# Patient Record
Sex: Male | Born: 1975 | Race: Black or African American | Hispanic: No | Marital: Married | Smoking: Former smoker
Health system: Southern US, Community
[De-identification: ages and names within clinical notes are randomized; demographics above are authoritative.]

## PROBLEM LIST (undated history)

## (undated) DIAGNOSIS — K219 Gastro-esophageal reflux disease without esophagitis: Secondary | ICD-10-CM

## (undated) DIAGNOSIS — J45909 Unspecified asthma, uncomplicated: Secondary | ICD-10-CM

## (undated) DIAGNOSIS — N289 Disorder of kidney and ureter, unspecified: Secondary | ICD-10-CM

## (undated) HISTORY — PX: EXPLORATORY LAPAROTOMY: SUR591

---

## 2014-08-16 ENCOUNTER — Encounter (HOSPITAL_COMMUNITY): Payer: Self-pay | Admitting: Emergency Medicine

## 2014-08-16 ENCOUNTER — Emergency Department (HOSPITAL_COMMUNITY)
Admission: EM | Admit: 2014-08-16 | Discharge: 2014-08-16 | Disposition: A | Discharge status: Home / Self Care | Payer: No Typology Code available for payment source | Attending: Emergency Medicine | Admitting: Emergency Medicine

## 2014-08-16 DIAGNOSIS — Y92410 Unspecified street and highway as the place of occurrence of the external cause: Secondary | ICD-10-CM | POA: Diagnosis not present

## 2014-08-16 DIAGNOSIS — Z8619 Personal history of other infectious and parasitic diseases: Secondary | ICD-10-CM | POA: Diagnosis not present

## 2014-08-16 DIAGNOSIS — J45909 Unspecified asthma, uncomplicated: Secondary | ICD-10-CM | POA: Insufficient documentation

## 2014-08-16 DIAGNOSIS — Z720 Tobacco use: Secondary | ICD-10-CM | POA: Insufficient documentation

## 2014-08-16 DIAGNOSIS — M79651 Pain in right thigh: Secondary | ICD-10-CM

## 2014-08-16 DIAGNOSIS — S79921A Unspecified injury of right thigh, initial encounter: Secondary | ICD-10-CM | POA: Insufficient documentation

## 2014-08-16 DIAGNOSIS — Y9389 Activity, other specified: Secondary | ICD-10-CM | POA: Insufficient documentation

## 2014-08-16 DIAGNOSIS — Y998 Other external cause status: Secondary | ICD-10-CM | POA: Insufficient documentation

## 2014-08-16 HISTORY — DX: Unspecified asthma, uncomplicated: J45.909

## 2014-08-16 HISTORY — DX: Gastro-esophageal reflux disease without esophagitis: K21.9

## 2014-08-16 MED ORDER — IBUPROFEN 800 MG PO TABS: 800.0000 mg | ORAL_TABLET | Freq: Once | ORAL | Status: DC
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
## 2015-05-14 ENCOUNTER — Emergency Department
Admission: EM | Admit: 2015-05-14 | Discharge: 2015-05-14 | Disposition: A | Discharge status: Home / Self Care | Payer: Self-pay | Attending: Emergency Medicine | Admitting: Emergency Medicine

## 2015-05-14 ENCOUNTER — Encounter: Payer: Self-pay | Admitting: Emergency Medicine

## 2015-05-14 ENCOUNTER — Emergency Department: Payer: Self-pay

## 2015-05-14 DIAGNOSIS — R3129 Other microscopic hematuria: Secondary | ICD-10-CM | POA: Insufficient documentation

## 2015-05-14 DIAGNOSIS — F17200 Nicotine dependence, unspecified, uncomplicated: Secondary | ICD-10-CM | POA: Insufficient documentation

## 2015-05-14 DIAGNOSIS — N23 Unspecified renal colic: Secondary | ICD-10-CM | POA: Insufficient documentation

## 2015-05-14 LAB — LIPASE, BLOOD: Lipase: 17 U/L (ref 11–51)

## 2015-05-14 LAB — COMPREHENSIVE METABOLIC PANEL
ALK PHOS: 28 U/L — AB (ref 38–126)
ALT: 15 U/L — AB (ref 17–63)
AST: 23 U/L (ref 15–41)
Albumin: 4.7 g/dL (ref 3.5–5.0)
Anion gap: 6 (ref 5–15)
BUN: 18 mg/dL (ref 6–20)
CALCIUM: 9.6 mg/dL (ref 8.9–10.3)
CO2: 27 mmol/L (ref 22–32)
CREATININE: 1.33 mg/dL — AB (ref 0.61–1.24)
Chloride: 107 mmol/L (ref 101–111)
Glucose, Bld: 94 mg/dL (ref 65–99)
Potassium: 4 mmol/L (ref 3.5–5.1)
Sodium: 140 mmol/L (ref 135–145)
Total Bilirubin: 0.7 mg/dL (ref 0.3–1.2)
Total Protein: 7 g/dL (ref 6.5–8.1)

## 2015-05-14 LAB — CBC
HCT: 42.5 % (ref 40.0–52.0)
Hemoglobin: 14 g/dL (ref 13.0–18.0)
MCH: 29.8 pg (ref 26.0–34.0)
MCHC: 33 g/dL (ref 32.0–36.0)
MCV: 90.4 fL (ref 80.0–100.0)
PLATELETS: 184 10*3/uL (ref 150–440)
RBC: 4.7 MIL/uL (ref 4.40–5.90)
RDW: 13.7 % (ref 11.5–14.5)
WBC: 5.4 10*3/uL (ref 3.8–10.6)

## 2015-05-14 LAB — URINALYSIS COMPLETE WITH MICROSCOPIC (ARMC ONLY)
BILIRUBIN URINE: NEGATIVE
Bacteria, UA: NONE SEEN
Glucose, UA: NEGATIVE mg/dL
KETONES UR: NEGATIVE mg/dL
Leukocytes, UA: NEGATIVE
Nitrite: NEGATIVE
PH: 5 (ref 5.0–8.0)
Protein, ur: NEGATIVE mg/dL
Specific Gravity, Urine: 1.02 (ref 1.005–1.030)
Squamous Epithelial / LPF: NONE SEEN

## 2015-05-14 MED ORDER — NAPROXEN 500 MG PO TABS: 500.0000 mg | ORAL_TABLET | Freq: Two times a day (BID) | ORAL | Status: DC

## 2015-05-14 MED ORDER — NAPROXEN 500 MG PO TABS
ORAL_TABLET | ORAL | Status: AC
  Filled 2015-05-14: qty 1

## 2015-05-14 MED ORDER — TAMSULOSIN HCL 0.4 MG PO CAPS
0.4000 mg | ORAL_CAPSULE | ORAL | Status: AC
  Administered 2015-05-14: 0.4 mg via ORAL

## 2015-05-14 MED ORDER — ONDANSETRON 4 MG PO TBDP
ORAL_TABLET | ORAL | Status: AC
  Filled 2015-05-14: qty 1

## 2015-05-14 MED ORDER — TAMSULOSIN HCL 0.4 MG PO CAPS
ORAL_CAPSULE | ORAL | Status: AC
  Filled 2015-05-14: qty 1

## 2015-05-14 MED ORDER — ONDANSETRON 4 MG PO TBDP: 4.0000 mg | ORAL_TABLET | Freq: Once | ORAL | Status: DC | PRN

## 2015-05-14 MED ORDER — NAPROXEN 500 MG PO TABS
500.0000 mg | ORAL_TABLET | Freq: Once | ORAL | Status: AC
  Administered 2015-05-14: 500 mg via ORAL

## 2015-05-14 NOTE — ED Notes (Signed)
Pt reports left side abd pain radiating into left back area.  Nausea earlier today.  No v/d.  States pain improved now but was worse while at work [employer].  Pt alert.

## 2015-05-14 NOTE — ED Provider Notes (Signed)
[facility] Emergency Department Provider Note  ____________________________________________  Time seen: 9:30 PM  I have reviewed the triage vital signs and the nursing notes.   HISTORY  Chief Complaint Abdominal Pain    HPI Marc Booth is a 40 y.o. male who complains of left flank pain radiating down into the left groin area that started earlier today. He's had multiple episodes that are severe, colicky, lasting 30-60 minutes at a time. No aggravating or alleviating factors. No vomiting or diarrhea but is associated with nausea. No chest pain shortness of breath fevers or chills. He has some dysuria but no urgency or hesitancy. No hematuria. He has had a kidney stone once before.  Most recent pain episode was at 5:00 PM tonight. He's been pain-free since 5:30 PM.   Past Medical History  Diagnosis Date  . Acid reflux   . Asthma    kidney stone   There are no active problems to display for this patient.    Past Surgical History  Procedure Laterality Date  . Exploratory laparotomy       No current outpatient prescriptions on file. None  Allergies Onion and Protonix   No family history on file.  Social History Social History  Substance Use Topics  . Smoking status: Current Every Day Smoker -- 0.25 packs/day for 4 years  . Smokeless tobacco: Never Used  . Alcohol Use: No    Review of Systems  Constitutional:   No fever or chills. No weight changes Eyes:   No blurry vision or double vision.  ENT:   No sore throat.  Cardiovascular:   No chest pain. Respiratory:   No dyspnea or cough. Gastrointestinal:   Negative for abdominal pain, vomiting and diarrhea.  No BRBPR or melena. Genitourinary:   Positive dysuria.. Musculoskeletal:   Left flank pain as above.. Skin:   Negative for rash. Neurological:   Negative for headaches, focal weakness or numbness. Psychiatric:  No anxiety or depression.   Endocrine:  No changes in energy or  sleep difficulty.  10-point ROS otherwise negative.  ____________________________________________   PHYSICAL EXAM:  VITAL SIGNS: ED Triage Vitals  Enc Vitals Group     BP 05/14/15 1849 110/78 mmHg     Pulse Rate 05/14/15 1849 72     Resp 05/14/15 1849 18     Temp 05/14/15 1849 97.6 F (36.4 C)     Temp Source 05/14/15 1849 Oral     SpO2 05/14/15 1849 98 %     Weight 05/14/15 1849 156 lb (70.761 kg)     Height 05/14/15 1849  (1.676 m)     Head Cir --      Peak Flow --      Pain Score 05/14/15 1849 10     Pain Loc --      Pain Edu? --      Excl. in GC? --     Vital signs reviewed, nursing assessments reviewed.   Constitutional:   Alert and oriented. Well appearing and in no distress. Eyes:   No scleral icterus. No conjunctival pallor. PERRL. EOMI ENT   Head:   Normocephalic and atraumatic.   Nose:   No congestion/rhinnorhea. No septal hematoma   Mouth/Throat:   MMM, no pharyngeal erythema. No peritonsillar mass.    Neck:   No stridor. No SubQ emphysema. No meningismus. Hematological/Lymphatic/Immunilogical:   No cervical lymphadenopathy. Cardiovascular:   RRR. Symmetric bilateral radial and DP pulses.  No murmurs.  Respiratory:   Normal  respiratory effort without tachypnea nor retractions. Breath sounds are clear and equal bilaterally. No wheezes/rales/rhonchi. Gastrointestinal:   Soft with left mid abdominal tenderness.. Non distended. There is left side CVA tenderness.  No rebound, rigidity, or guarding. Genitourinary:   deferred Musculoskeletal:   Nontender with normal range of motion in all extremities. No joint effusions.  No lower extremity tenderness.  No edema. Neurologic:   Normal speech and language.  CN 2-10 normal. Motor grossly intact. No gross focal neurologic deficits are appreciated.  Skin:    Skin is warm, dry and intact. No rash noted.  No petechiae, purpura, or bullae. Psychiatric:   Mood and affect are  normal. ____________________________________________    LABS (pertinent positives/negatives) (all labs ordered are listed, but only abnormal results are displayed) Labs Reviewed  COMPREHENSIVE METABOLIC PANEL - Abnormal; Notable for the following:    Creatinine, Ser 1.33 (*)    ALT 15 (*)    Alkaline Phosphatase 28 (*)    All other components within normal limits  URINALYSIS COMPLETEWITH MICROSCOPIC ([facility] ONLY) - Abnormal; Notable for the following:    Color, Urine YELLOW (*)    APPearance CLEAR (*)    Hgb urine dipstick 2+ (*)    All other components within normal limits  LIPASE, BLOOD  CBC   ____________________________________________   EKG    ____________________________________________    RADIOLOGY  CT abdomen pelvis reveals 2 mm left UVJ stone, no obstruction.  ____________________________________________   PROCEDURES   ____________________________________________   INITIAL IMPRESSION / ASSESSMENT AND PLAN / ED COURSE  Pertinent labs & imaging results that were available during my care of the patient were reviewed by me and considered in my medical decision making (see chart for details).  Patient presents with symptoms consistent with left-sided renal colic. He has some microscopic hematuria on urinalysis consistent with ureterolithiasis. Labs are otherwise unremarkable except for creatinine of 1.3. No evidence of infection whatsoever. Her blood cell count normal. With his tenderness on exam and creatinine we will get a CT scan of the abdomen and pelvis to evaluate for an obstructing stone.   ----------------------------------------- 10:37 PM on 05/14/2015 -----------------------------------------  No further pain. Calm and comfortable. We'll give Flomax and naproxen here, discharge home with naproxen.    ____________________________________________   FINAL CLINICAL IMPRESSION(S) / ED DIAGNOSES  Final diagnoses:  Renal colic on left side   Microscopic hematuria      Sharman CheekPhillip Sharnese Heath, MD 05/14/15 2237

## 2015-05-14 NOTE — ED Notes (Signed)
Patient reports severe LLQ abdominal pain that began at work approx 1 hour ago. States he had similar episode a few days ago. Patient also c/o nausea. Denies diarrhea or fevers.

## 2015-05-14 NOTE — Discharge Instructions (Signed)
Kidney Stones °Kidney stones (urolithiasis) are deposits that form inside your kidneys. The intense pain is caused by the stone moving through the urinary tract. When the stone moves, the ureter goes into spasm around the stone. The stone is usually passed in the urine.  °CAUSES  °· A disorder that makes certain neck glands produce too much parathyroid hormone (primary hyperparathyroidism). °· A buildup of uric acid crystals, similar to gout in your joints. °· Narrowing (stricture) of the ureter. °· A kidney obstruction present at birth (congenital obstruction). °· Previous surgery on the kidney or ureters. °· Numerous kidney infections. °SYMPTOMS  °· Feeling sick to your stomach (nauseous). °· Throwing up (vomiting). °· Blood in the urine (hematuria). °· Pain that usually spreads (radiates) to the groin. °· Frequency or urgency of urination. °DIAGNOSIS  °· Taking a history and physical exam. °· Blood or urine tests. °· CT scan. °· Occasionally, an examination of the inside of the urinary bladder (cystoscopy) is performed. °TREATMENT  °· Observation. °· Increasing your fluid intake. °· Extracorporeal shock wave lithotripsy--This is a noninvasive procedure that uses shock waves to break up kidney stones. °· Surgery may be needed if you have severe pain or persistent obstruction. There are various surgical procedures. Most of the procedures are performed with the use of small instruments. Only small incisions are needed to accommodate these instruments, so recovery time is minimized. °The size, location, and chemical composition are all important variables that will determine the proper choice of action for you. Talk to your health care provider to better understand your situation so that you will minimize the risk of injury to yourself and your kidney.  °HOME CARE INSTRUCTIONS  °· Drink enough water and fluids to keep your urine clear or pale yellow. This will help you to pass the stone or stone fragments. °· Strain  all urine through the provided strainer. Keep all particulate matter and stones for your health care provider to see. The stone causing the pain may be as small as a grain of salt. It is very important to use the strainer each and every time you pass your urine. The collection of your stone will allow your health care provider to analyze it and verify that a stone has actually passed. The stone analysis will often identify what you can do to reduce the incidence of recurrences. °· Only take over-the-counter or prescription medicines for pain, discomfort, or fever as directed by your health care provider. °· Keep all follow-up visits as told by your health care provider. This is important. °· Get follow-up X-rays if required. The absence of pain does not always mean that the stone has passed. It may have only stopped moving. If the urine remains completely obstructed, it can cause loss of kidney function or even complete destruction of the kidney. It is your responsibility to make sure X-rays and follow-ups are completed. Ultrasounds of the kidney can show blockages and the status of the kidney. Ultrasounds are not associated with any radiation and can be performed easily in a matter of minutes. °· Make changes to your daily diet as told by your health care provider. You may be told to: °¨ Limit the amount of salt that you eat. °¨ Eat 5 or more servings of fruits and vegetables each day. °¨ Limit the amount of meat, poultry, fish, and eggs that you eat. °· Collect a 24-hour urine sample as told by your health care provider. You may need to collect another urine sample every 6-12   months. °SEEK MEDICAL CARE IF: °· You experience pain that is progressive and unresponsive to any pain medicine you have been prescribed. °SEEK IMMEDIATE MEDICAL CARE IF:  °· Pain cannot be controlled with the prescribed medicine. °· You have a fever or shaking chills. °· The severity or intensity of pain increases over 18 hours and is not  relieved by pain medicine. °· You develop a new onset of abdominal pain. °· You feel faint or pass out. °· You are unable to urinate. °  °This information is not intended to replace advice given to you by your health care provider. Make sure you discuss any questions you have with your health care provider. °  °Document Released: 02/16/2005 Document Revised: 11/07/2014 Document Reviewed: 07/20/2012 °Elsevier Interactive Patient Education ©2016 [employer] ° °Renal Colic °Renal colic is pain that is caused by passing a kidney stone. The pain can be sharp and severe. It may be felt in the back, abdomen, side (flank), or groin. It can cause nausea. Renal colic can come and go. °HOME CARE INSTRUCTIONS °Watch your condition for any changes. The following actions may help to lessen any discomfort that you are feeling: °· Take medicines only as directed by your health care provider. °· Ask your health care provider if it is okay to take over-the-counter pain medicine. °· Drink enough fluid to keep your urine clear or pale yellow. Drink 6-8 glasses of water each day. °· Limit the amount of salt that you eat to less than 2 grams per day. °· Reduce the amount of protein in your diet. Eat less meat, fish, nuts, and dairy. °· Avoid foods such as spinach, rhubarb, nuts, or bran. These may make kidney stones more likely to form. °SEEK MEDICAL CARE IF: °· You have a fever or chills. °· Your urine smells bad or looks cloudy. °· You have pain or burning when you pass urine. °SEEK IMMEDIATE MEDICAL CARE IF: °· Your flank pain or groin pain suddenly worsens. °· You become confused or disoriented or you lose consciousness. °  °This information is not intended to replace advice given to you by your health care provider. Make sure you discuss any questions you have with your health care provider. °  °Document Released: 11/26/2004 Document Revised: 03/09/2014 Document Reviewed: 12/27/2013 °Elsevier Interactive Patient Education ©2016  [employer] ° °

## 2015-05-14 NOTE — ED Notes (Signed)
Patient transported to CT 

## 2015-05-16 MED ORDER — DEXAMETHASONE SODIUM PHOSPHATE 10 MG/ML IJ SOLN
INTRAMUSCULAR | Status: AC
  Filled 2015-05-16: qty 1

## 2015-05-16 MED ORDER — IPRATROPIUM-ALBUTEROL 0.5-2.5 (3) MG/3ML IN SOLN
RESPIRATORY_TRACT | Status: AC
Start: 2015-05-16 — End: 2015-05-16
  Filled 2015-05-16: qty 3

## 2017-02-12 ENCOUNTER — Other Ambulatory Visit: Payer: Self-pay

## 2017-02-12 ENCOUNTER — Emergency Department (HOSPITAL_COMMUNITY)
Admission: EM | Admit: 2017-02-12 | Discharge: 2017-02-12 | Disposition: A | Discharge status: Home / Self Care | Payer: No Typology Code available for payment source | Attending: Emergency Medicine | Admitting: Emergency Medicine

## 2017-02-12 ENCOUNTER — Encounter (HOSPITAL_COMMUNITY): Payer: Self-pay

## 2017-02-12 DIAGNOSIS — Z87891 Personal history of nicotine dependence: Secondary | ICD-10-CM | POA: Insufficient documentation

## 2017-02-12 DIAGNOSIS — J45909 Unspecified asthma, uncomplicated: Secondary | ICD-10-CM | POA: Insufficient documentation

## 2017-02-12 DIAGNOSIS — R319 Hematuria, unspecified: Secondary | ICD-10-CM | POA: Insufficient documentation

## 2017-02-12 DIAGNOSIS — Z79899 Other long term (current) drug therapy: Secondary | ICD-10-CM | POA: Insufficient documentation

## 2017-02-12 HISTORY — DX: Disorder of kidney and ureter, unspecified: N28.9

## 2017-02-12 LAB — CBC
HCT: 41.7 % (ref 39.0–52.0)
Hemoglobin: 13.8 g/dL (ref 13.0–17.0)
MCH: 29.7 pg (ref 26.0–34.0)
MCHC: 33.1 g/dL (ref 30.0–36.0)
MCV: 89.9 fL (ref 78.0–100.0)
Platelets: 213 10*3/uL (ref 150–400)
RBC: 4.64 MIL/uL (ref 4.22–5.81)
RDW: 14 % (ref 11.5–15.5)
WBC: 5.1 10*3/uL (ref 4.0–10.5)

## 2017-02-12 LAB — URINALYSIS, ROUTINE W REFLEX MICROSCOPIC
BACTERIA UA: NONE SEEN
Bilirubin Urine: NEGATIVE
Glucose, UA: NEGATIVE mg/dL
Ketones, ur: 5 mg/dL — AB
Leukocytes, UA: NEGATIVE
Nitrite: NEGATIVE
PROTEIN: NEGATIVE mg/dL
Specific Gravity, Urine: 1.018 (ref 1.005–1.030)
pH: 5 (ref 5.0–8.0)

## 2017-02-12 LAB — BASIC METABOLIC PANEL
Anion gap: 9 (ref 5–15)
BUN: 10 mg/dL (ref 6–20)
CALCIUM: 9.7 mg/dL (ref 8.9–10.3)
CO2: 25 mmol/L (ref 22–32)
Chloride: 105 mmol/L (ref 101–111)
Creatinine, Ser: 1.09 mg/dL (ref 0.61–1.24)
GFR calc Af Amer: >60 mL/min (ref >60)
GLUCOSE: 87 mg/dL (ref 65–99)
Potassium: 4.1 mmol/L (ref 3.5–5.1)
SODIUM: 139 mmol/L (ref 135–145)

## 2017-02-12 NOTE — ED Provider Notes (Signed)
[facility]ENC[facility] Provider Note   CSN: [id number] Arrival date & time: 02/12/17  1202     History   Chief Complaint Chief Complaint  Patient presents with  . Hematuria    HPI Marc JunkerMichael Booth is a 41 y.o. male.   He presents for evaluation of blood in urine after shoveling rash.  This occurred several days ago and since then he now has clear urine.  He was seen at an urgent care who plans on arranging some follow-up for him.  He has a history of kidney stones but has never seen a urologist.  He denies flank pain, abdominal pain, back pain, dizziness, fever, chills or chest pain.  He has mild pain in his penis, with voiding.  He denies urinary frequency.  No prior similar problems.  His discomfort does not feel like prior problems with kidney stone passage.  There are no other known modifying factors.  HPI  Past Medical History:  Diagnosis Date  . Acid reflux   . Asthma   . Renal disorder    Kidney Stones    There are no active problems to display for this patient.   Past Surgical History:  Procedure Laterality Date  . EXPLORATORY LAPAROTOMY         Home Medications    Prior to Admission medications   Medication Sig Start Date End Date Taking? Authorizing Provider  naproxen (NAPROSYN) 500 MG tablet Take 1 tablet (500 mg total) by mouth 2 (two) times daily with a meal. 05/14/15   Sharman CheekStafford, Phillip, MD    Family History No family history on file.  Social History Social History   Tobacco Use  . Smoking status: Former Smoker    Packs/day: 0.25    Years: 4.00    Pack years: 1.00  . Smokeless tobacco: Never Used  Substance Use Topics  . Alcohol use: No  . Drug use: No     Allergies   Onion and Protonix [pantoprazole sodium]   Review of Systems Review of Systems  All other systems reviewed and are negative.    Physical Exam Updated Vital Signs BP 126/77   Pulse 68   Temp 97.6 F (36.4 C) (Oral)   Resp 16   Ht  5\' 6"  (1.676 m)   Wt 69.9 kg (154 lb)   SpO2 98%   BMI 24.86 kg/m   Physical Exam  Constitutional: He is oriented to person, place, and time. He appears well-developed and well-nourished. No distress.  HENT:  Head: Normocephalic and atraumatic.  Right Ear: External ear normal.  Left Ear: External ear normal.  Eyes: Conjunctivae and EOM are normal. Pupils are equal, round, and reactive to light.  Neck: Normal range of motion and phonation normal. Neck supple.  Cardiovascular: Normal rate.  Pulmonary/Chest: Effort normal. He exhibits no bony tenderness.  Musculoskeletal: Normal range of motion.  Neurological: He is alert and oriented to person, place, and time. No cranial nerve deficit or sensory deficit. He exhibits normal muscle tone. Coordination normal.  Skin: Skin is warm, dry and intact.  Psychiatric: He has a normal mood and affect. His behavior is normal. Judgment and thought content normal.  Nursing note and vitals reviewed.    ED Treatments / Results  Labs (all labs ordered are listed, but only abnormal results are displayed) Labs Reviewed  URINALYSIS, ROUTINE W REFLEX MICROSCOPIC - Abnormal; Notable for the following components:      Result Value   APPearance HAZY (*)  Hgb urine dipstick LARGE (*)    Ketones, ur 5 (*)    Squamous Epithelial / LPF 0-5 (*)    All other components within normal limits  BASIC METABOLIC PANEL  CBC    EKG  EKG Interpretation None       Radiology No results found.  Procedures Procedures (including critical care time)  Medications Ordered in ED Medications - No data to display   Initial Impression / Assessment and Plan / ED Course  I have reviewed the triage vital signs and the nursing notes.  Pertinent labs & imaging results that were available during my care of the patient were reviewed by me and considered in my medical decision making (see chart for details).  Clinical Course as of Feb 13 1839  Fri Feb 12, 2017    [id number] Abnormal Hgb urine dipstick: (!) LARGE [EW]  1839 Abnormal RBC / HPF: TOO NUMEROUS TO COUNT [EW]    Clinical Course User Index [EW] Mancel BaleWentz, Lynasia Meloche, MD     Patient Vitals for the past 24 hrs:  BP Temp Temp src Pulse Resp SpO2 Height Weight  02/12/17 1645 119/85 - - 72 - 100 % - -  02/12/17 1630 (!) 124/92 - - 70 - 99 % - -  02/12/17 1526 126/77 - - 68 16 98 % - -  02/12/17 1207 131/89 97.6 F (36.4 C) Oral 83 16 98 % 5\' 6"  (1.676 m) 69.9 kg (154 lb)    5:27 PM Reevaluation with update and discussion. After initial assessment and treatment, an updated evaluation reveals no change in clinical status.  Findings discussed with the patient and all questions were answered. Mancel BaleElliott Sourish Allender   CK ordered not returned at time of discharge  Final Clinical Impressions(s) / ED Diagnoses   Final diagnoses:  Hematuria, unspecified type    Hematuria, initially gross now improving.  Patient with history of urolithiasis, but does not have flank pain at this time.  Doubt ureteral obstruction, serious bacterial infection or metabolic instability.  Nursing Notes Reviewed/ Care Coordinated Applicable Imaging Reviewed Interpretation of Laboratory Data incorporated into ED treatment  The patient appears reasonably screened and/or stabilized for discharge and I doubt any other medical condition or other [facility] requiring further screening, evaluation, or treatment in the ED at this time prior to discharge.  Plan: Home Medications-APAP for pain as needed; Home Treatments-drink plenty of fluids; return here if the recommended treatment, does not improve the symptoms; Recommended follow up-follow-up with urology regarding hematuria, 1 or 2 weeks.    ED Discharge Orders    None       Mancel BaleWentz, Laurieanne Galloway, MD 02/12/17 1840

## 2017-02-12 NOTE — Discharge Instructions (Signed)
There were no serious findings today associated with the small amount of blood (microscopic), seen on the testing today.  For pain you can take Tylenol, 650 mg, every 4 hours, as needed.  Call the urologist for a follow-up appointment.

## 2017-02-12 NOTE — ED Triage Notes (Signed)
Per Pt, Pt is coming from home with complaints of hematuria that started after he shoveled snow a couple days ago. [facility] protein [facility]. Pt was told to see Urologist. He was unable to get in contact, and now he is starting to have pain.

## 2017-02-15 ENCOUNTER — Emergency Department: Payer: Self-pay

## 2017-02-15 ENCOUNTER — Other Ambulatory Visit: Payer: Self-pay

## 2017-02-15 ENCOUNTER — Encounter: Payer: Self-pay | Admitting: Emergency Medicine

## 2017-02-15 ENCOUNTER — Emergency Department
Admission: EM | Admit: 2017-02-15 | Discharge: 2017-02-15 | Disposition: A | Discharge status: Home / Self Care | Payer: Self-pay | Attending: Emergency Medicine | Admitting: Emergency Medicine

## 2017-02-15 DIAGNOSIS — Z87891 Personal history of nicotine dependence: Secondary | ICD-10-CM | POA: Insufficient documentation

## 2017-02-15 DIAGNOSIS — J45909 Unspecified asthma, uncomplicated: Secondary | ICD-10-CM | POA: Insufficient documentation

## 2017-02-15 DIAGNOSIS — N200 Calculus of kidney: Secondary | ICD-10-CM | POA: Insufficient documentation

## 2017-02-15 LAB — CBC WITH DIFFERENTIAL/PLATELET
BASOS ABS: 0 10*3/uL (ref 0–0.1)
Basophils Relative: 1 %
EOS PCT: 3 %
Eosinophils Absolute: 0.1 10*3/uL (ref 0–0.7)
HCT: 40.6 % (ref 40.0–52.0)
Hemoglobin: 13.2 g/dL (ref 13.0–18.0)
LYMPHS PCT: 38 %
Lymphs Abs: 1.6 10*3/uL (ref 1.0–3.6)
MCH: 29.3 pg (ref 26.0–34.0)
MCHC: 32.6 g/dL (ref 32.0–36.0)
MCV: 89.8 fL (ref 80.0–100.0)
Monocytes Absolute: 0.3 10*3/uL (ref 0.2–1.0)
Monocytes Relative: 7 %
NEUTROS ABS: 2.1 10*3/uL (ref 1.4–6.5)
Neutrophils Relative %: 51 %
Platelets: 210 10*3/uL (ref 150–440)
RBC: 4.52 MIL/uL (ref 4.40–5.90)
RDW: 13.9 % (ref 11.5–14.5)
WBC: 4.2 10*3/uL (ref 3.8–10.6)

## 2017-02-15 LAB — COMPREHENSIVE METABOLIC PANEL
ALT: 14 U/L — ABNORMAL LOW (ref 17–63)
ANION GAP: 8 (ref 5–15)
AST: 21 U/L (ref 15–41)
Albumin: 4.3 g/dL (ref 3.5–5.0)
Alkaline Phosphatase: 30 U/L — ABNORMAL LOW (ref 38–126)
BILIRUBIN TOTAL: 0.5 mg/dL (ref 0.3–1.2)
BUN: 13 mg/dL (ref 6–20)
CALCIUM: 9.5 mg/dL (ref 8.9–10.3)
CO2: 28 mmol/L (ref 22–32)
Chloride: 105 mmol/L (ref 101–111)
Creatinine, Ser: 1.04 mg/dL (ref 0.61–1.24)
GFR calc Af Amer: >60 mL/min (ref >60)
GFR calc non Af Amer: >60 mL/min (ref >60)
GLUCOSE: 86 mg/dL (ref 65–99)
Potassium: 4.1 mmol/L (ref 3.5–5.1)
Sodium: 141 mmol/L (ref 135–145)
Total Protein: 6.8 g/dL (ref 6.5–8.1)

## 2017-02-15 LAB — URINALYSIS, COMPLETE (UACMP) WITH MICROSCOPIC
BACTERIA UA: NONE SEEN
BILIRUBIN URINE: NEGATIVE
Glucose, UA: NEGATIVE mg/dL
Hgb urine dipstick: NEGATIVE
Ketones, ur: NEGATIVE mg/dL
LEUKOCYTES UA: NEGATIVE
NITRITE: NEGATIVE
Protein, ur: NEGATIVE mg/dL
Specific Gravity, Urine: 1.02 (ref 1.005–1.030)
pH: 6 (ref 5.0–8.0)

## 2017-02-15 LAB — CHLAMYDIA/NGC RT PCR (ARMC ONLY)
Chlamydia Tr: NOT DETECTED
N gonorrhoeae: NOT DETECTED

## 2017-02-15 MED ORDER — TAMSULOSIN HCL 0.4 MG PO CAPS: 0.4000 mg | ORAL_CAPSULE | Freq: Every day | ORAL | 0 refills | Status: DC

## 2017-02-15 MED ORDER — SODIUM CHLORIDE 0.9 % IV BOLUS (SEPSIS)
1000.0000 mL | Freq: Once | INTRAVENOUS | Status: AC
  Administered 2017-02-15: 1000 mL via INTRAVENOUS

## 2017-02-15 MED ORDER — TRAMADOL HCL 50 MG PO TABS: 50.0000 mg | ORAL_TABLET | Freq: Four times a day (QID) | ORAL | 0 refills | Status: DC | PRN

## 2017-02-15 NOTE — ED Notes (Signed)
Pt reports pain in left groin area and penis for several days.  Pt states no penile discharge   Hx kidney stones.  No n/v/d.  No dysuria. Pt denies back pain.  Pt alert.

## 2017-02-15 NOTE — ED Provider Notes (Signed)
[facility] Emergency Department Provider Note  ____________________________________________  Time seen: Approximately 5:15 PM  I have reviewed the triage vital signs and the nursing notes.   HISTORY  Chief Complaint Dysuria    HPI Marc Booth is a 41 y.o. male resents the emergency department complaining of dysuria.  Patient was evaluated at [facility] 3 days prior for complaints of hematuria.  Patient reports that symptoms began several days ago.  Initially he had frank blood in his urine and was evaluated at urgent care.  Patient reports that he was then referred to the emergency department for possible kidney stones.  At that time, patient did not have frank hematuria but did have microscopic hematuria on urinalysis.  Initially, patient had some back pain which she attributed to shoveling snow.  Back pain had fully resolved when he was seen at [facility].  Today, patient denies any back pain, flank pain, abdominal pain, nausea vomiting, fever or chills.  Patient is endorsing continued sharp dysuria.  He denies any penile discharge.  He states that he has had only one sexual partner, his wife and there is no concern for STD.  Patient denies any frank hematuria at this time.  Patient is concerned as he has ongoing symptoms of dysuria.  Patient does have a history of kidney stones but states that nobody has done any imaging to evaluate whether he has a stone or not.  Past Medical History:  Diagnosis Date  . Acid reflux   . Asthma   . Renal disorder    Kidney Stones    There are no active problems to display for this patient.   Past Surgical History:  Procedure Laterality Date  . EXPLORATORY LAPAROTOMY      Prior to Admission medications   Medication Sig Start Date End Date Taking? Authorizing Provider  naproxen (NAPROSYN) 500 MG tablet Take 1 tablet (500 mg total) by mouth 2 (two) times daily with a meal. Patient not taking: Reported on 02/12/2017  05/14/15   Sharman CheekStafford, Phillip, MD  tamsulosin (FLOMAX) 0.4 MG CAPS capsule Take 1 capsule (0.4 mg total) by mouth daily. 02/15/17   Cuthriell, Delorise RoyalsJonathan D, PA-C  traMADol (ULTRAM) 50 MG tablet Take 1 tablet (50 mg total) by mouth every 6 (six) hours as needed. 02/15/17   Cuthriell, Delorise RoyalsJonathan D, PA-C    Allergies Onion and Protonix [pantoprazole sodium]  No family history on file.  Social History Social History   Tobacco Use  . Smoking status: Former Smoker    Packs/day: 0.25    Years: 4.00    Pack years: 1.00  . Smokeless tobacco: Never Used  Substance Use Topics  . Alcohol use: No  . Drug use: No     Review of Systems  Constitutional: No fever/chills Eyes: No visual changes. No discharge ENT: No upper respiratory complaints. Cardiovascular: no chest pain. Respiratory: no cough. No SOB. Gastrointestinal: No abdominal pain.  No nausea, no vomiting.  No diarrhea.  No constipation. Genitourinary: Patient initially had frank hematuria, no visible hematuria at this time.  Patient continues to have dysuria.  No penile discharge. Musculoskeletal: Negative for musculoskeletal pain. Skin: Negative for rash, abrasions, lacerations, ecchymosis. Neurological: Negative for headaches, focal weakness or numbness. 10-point ROS otherwise negative.  ____________________________________________   PHYSICAL EXAM:  VITAL SIGNS: ED Triage Vitals  Enc Vitals Group     BP 02/15/17 1708 127/84     Pulse Rate 02/15/17 1708 72     Resp 02/15/17 1708 18  Temp 02/15/17 1708 98 F (36.7 C)     Temp Source 02/15/17 1708 Oral     SpO2 02/15/17 1708 97 %     Weight 02/15/17 1708 154 lb (69.9 kg)     Height 02/15/17 1708 5\' 6"  (1.676 m)     Head Circumference --      Peak Flow --      Pain Score 02/15/17 1707 7     Pain Loc --      Pain Edu? --      Excl. in GC? --      Constitutional: Alert and oriented. Well appearing and in no acute distress. Eyes: Conjunctivae are normal. PERRL.  EOMI. Head: Atraumatic. ENT:      Ears:       Nose: No congestion/rhinnorhea.      Mouth/Throat: Mucous membranes are moist.  Neck: No stridor.    Cardiovascular: Normal rate, regular rhythm. Normal S1 and S2.  Good peripheral circulation. Respiratory: Normal respiratory effort without tachypnea or retractions. Lungs CTAB. Good air entry to the bases with no decreased or absent breath sounds. Gastrointestinal: Bowel sounds 4 quadrants. Soft and nontender to palpation. No guarding or rigidity. No palpable masses. No distention.  Patient did have mild left-sided CVA tenderness on exam. Musculoskeletal: Full range of motion to all extremities. No gross deformities appreciated. Neurologic:  Normal speech and language. No gross focal neurologic deficits are appreciated.  Skin:  Skin is warm, dry and intact. No rash noted. Psychiatric: Mood and affect are normal. Speech and behavior are normal. Patient exhibits appropriate insight and judgement.   ____________________________________________   LABS (all labs ordered are listed, but only abnormal results are displayed)  Labs Reviewed  URINALYSIS, COMPLETE (UACMP) WITH MICROSCOPIC - Abnormal; Notable for the following components:      Result Value   Color, Urine YELLOW (*)    APPearance CLEAR (*)    Squamous Epithelial / LPF 0-5 (*)    All other components within normal limits  COMPREHENSIVE METABOLIC PANEL - Abnormal; Notable for the following components:   ALT 14 (*)    Alkaline Phosphatase 30 (*)    All other components within normal limits  CHLAMYDIA/NGC RT PCR ([facility] ONLY)  CBC WITH DIFFERENTIAL/PLATELET   ____________________________________________  EKG   ____________________________________________  RADIOLOGY Festus BarrenI, Jonathan D Cuthriell, personally viewed and evaluated these images as part of my medical decision making, as well as reviewing the written report by the radiologist.  Ct Renal Stone Study  Result Date:  02/15/2017 CLINICAL DATA:  Hematuria with left groin pain EXAM: CT ABDOMEN AND PELVIS WITHOUT CONTRAST TECHNIQUE: Multidetector CT imaging of the abdomen and pelvis was performed following the standard protocol without IV contrast. COMPARISON:  CT abdomen pelvis 05/14/2015 FINDINGS: Lower chest: Negative Hepatobiliary: Negative Pancreas: Negative Spleen: Negative Adrenals/Urinary Tract: Previously noted left midpole calculus no longer present. Left ureter nondilated however there are 2 5mm adjacent stones in the distal left ureter similar in size to the midpole stone seen on the prior study. No right renal calculi or obstruction. 12 mm hypodense lesion left midpole cortex unchanged, likely a cyst. Stomach/Bowel: Negative for bowel obstruction. No bowel mass or edema. Normal appendix Vascular/Lymphatic: Negative Reproductive: Negative Other: No free fluid. Musculoskeletal: Negative IMPRESSION: Two 5 mm calculi in the distal left ureter without hydronephrosis. No other renal calculi. Stable left renal hypodense lesion likely a 12 mm cyst. Electronically Signed   By: Marlan Palauharles  Clark M.D.   On: 02/15/2017 18:35  ____________________________________________    PROCEDURES  Procedure(s) performed:    Procedures    Medications  sodium chloride 0.9 % bolus 1,000 mL (1,000 mLs Intravenous New Bag/Given 02/15/17 1747)     ____________________________________________   INITIAL IMPRESSION / ASSESSMENT AND PLAN / ED COURSE  Pertinent labs & imaging results that were available during my care of the patient were reviewed by me and considered in my medical decision making (see chart for details).  Review of the [place] CSRS was performed in accordance of the [facility] prior to dispensing any controlled drugs.     Patient's diagnosis is consistent with nephrolithiasis.  Patient has 2 5 mm stones in the distal left ureter.  No visual obstruction on CT.  Differential included kidney stone versus UTI versus  gonorrhea and chlamydia versus cystitis.  Labs returned with reassuring results.  No indication for further workup.. Patient will be discharged home with prescriptions for Flomax and tramadol. Patient is to follow up with primary care as needed or otherwise directed. Patient is given ED precautions to return to the ED for any worsening or new symptoms.     ____________________________________________  FINAL CLINICAL IMPRESSION(S) / ED DIAGNOSES  Final diagnoses:  Kidney stones      NEW MEDICATIONS STARTED DURING THIS VISIT:  ED Discharge Orders        Ordered    traMADol (ULTRAM) 50 MG tablet  Every 6 hours PRN     02/15/17 1908    tamsulosin (FLOMAX) 0.4 MG CAPS capsule  Daily     02/15/17 1908          This chart was dictated using voice recognition software/Dragon. Despite best efforts to proofread, errors can occur which can change the meaning. Any change was purely unintentional.    Racheal Patches, PA-C 02/15/17 1910    Sharyn Creamer, MD 02/16/17 [phone]

## 2017-02-15 NOTE — ED Triage Notes (Signed)
Pain with urination x 3 days

## 2017-11-03 ENCOUNTER — Emergency Department (HOSPITAL_COMMUNITY)
Admission: EM | Admit: 2017-11-03 | Discharge: 2017-11-03 | Disposition: A | Discharge status: Home / Self Care | Payer: 59 | Attending: Emergency Medicine | Admitting: Emergency Medicine

## 2017-11-03 ENCOUNTER — Encounter (HOSPITAL_COMMUNITY): Payer: Self-pay

## 2017-11-03 ENCOUNTER — Other Ambulatory Visit: Payer: Self-pay

## 2017-11-03 DIAGNOSIS — R200 Anesthesia of skin: Secondary | ICD-10-CM | POA: Insufficient documentation

## 2017-11-03 DIAGNOSIS — Z87891 Personal history of nicotine dependence: Secondary | ICD-10-CM | POA: Insufficient documentation

## 2017-11-03 DIAGNOSIS — J45909 Unspecified asthma, uncomplicated: Secondary | ICD-10-CM | POA: Insufficient documentation

## 2017-11-03 DIAGNOSIS — R202 Paresthesia of skin: Secondary | ICD-10-CM

## 2017-11-03 MED ORDER — PREDNISONE 20 MG PO TABS
60.0000 mg | ORAL_TABLET | Freq: Once | ORAL | Status: AC
  Administered 2017-11-03: 60 mg via ORAL
  Filled 2017-11-03: qty 3

## 2017-11-03 MED ORDER — PREDNISONE 20 MG PO TABS: ORAL_TABLET | ORAL | 0 refills | Status: DC

## 2017-11-03 MED ORDER — KETOROLAC TROMETHAMINE 60 MG/2ML IM SOLN
60.0000 mg | Freq: Once | INTRAMUSCULAR | Status: AC
  Administered 2017-11-03: 60 mg via INTRAMUSCULAR
  Filled 2017-11-03: qty 2
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
## 2017-11-03 NOTE — ED Triage Notes (Signed)
Pt states he first noticed numbness and tingling in his left arm yesterday, but it got worse today. Pt has FROM in arm, but says when he moves it "it just feels different" .  No complaints in legs.

## 2018-05-14 ENCOUNTER — Emergency Department
Admission: EM | Admit: 2018-05-14 | Discharge: 2018-05-14 | Disposition: A | Discharge status: Home / Self Care | Payer: 59 | Attending: Emergency Medicine | Admitting: Emergency Medicine

## 2018-05-14 ENCOUNTER — Encounter: Payer: Self-pay | Admitting: Emergency Medicine

## 2018-05-14 ENCOUNTER — Other Ambulatory Visit: Payer: Self-pay

## 2018-05-14 DIAGNOSIS — T39314A Poisoning by propionic acid derivatives, undetermined, initial encounter: Secondary | ICD-10-CM

## 2018-05-14 DIAGNOSIS — Y92009 Unspecified place in unspecified non-institutional (private) residence as the place of occurrence of the external cause: Secondary | ICD-10-CM | POA: Insufficient documentation

## 2018-05-14 DIAGNOSIS — F4325 Adjustment disorder with mixed disturbance of emotions and conduct: Secondary | ICD-10-CM | POA: Diagnosis present

## 2018-05-14 DIAGNOSIS — R45851 Suicidal ideations: Secondary | ICD-10-CM | POA: Insufficient documentation

## 2018-05-14 DIAGNOSIS — F410 Panic disorder [episodic paroxysmal anxiety] without agoraphobia: Secondary | ICD-10-CM | POA: Diagnosis present

## 2018-05-14 DIAGNOSIS — F319 Bipolar disorder, unspecified: Secondary | ICD-10-CM | POA: Insufficient documentation

## 2018-05-14 DIAGNOSIS — J45909 Unspecified asthma, uncomplicated: Secondary | ICD-10-CM | POA: Insufficient documentation

## 2018-05-14 DIAGNOSIS — T39392A Poisoning by other nonsteroidal anti-inflammatory drugs [NSAID], intentional self-harm, initial encounter: Secondary | ICD-10-CM | POA: Insufficient documentation

## 2018-05-14 DIAGNOSIS — F17200 Nicotine dependence, unspecified, uncomplicated: Secondary | ICD-10-CM | POA: Insufficient documentation

## 2018-05-14 DIAGNOSIS — Y999 Unspecified external cause status: Secondary | ICD-10-CM | POA: Insufficient documentation

## 2018-05-14 DIAGNOSIS — T39311A Poisoning by propionic acid derivatives, accidental (unintentional), initial encounter: Secondary | ICD-10-CM

## 2018-05-14 DIAGNOSIS — Y9389 Activity, other specified: Secondary | ICD-10-CM | POA: Insufficient documentation

## 2018-05-14 DIAGNOSIS — X838XXA Intentional self-harm by other specified means, initial encounter: Secondary | ICD-10-CM | POA: Insufficient documentation

## 2018-05-14 LAB — CBC
HEMATOCRIT: 46.1 % (ref 39.0–52.0)
HEMOGLOBIN: 15.3 g/dL (ref 13.0–17.0)
MCH: 29.8 pg (ref 26.0–34.0)
MCHC: 33.2 g/dL (ref 30.0–36.0)
MCV: 89.7 fL (ref 80.0–100.0)
NRBC: 0 % (ref 0.0–0.2)
Platelets: 233 10*3/uL (ref 150–400)
RBC: 5.14 MIL/uL (ref 4.22–5.81)
RDW: 13.4 % (ref 11.5–15.5)
WBC: 7.1 10*3/uL (ref 4.0–10.5)

## 2018-05-14 LAB — COMPREHENSIVE METABOLIC PANEL
ALBUMIN: 4.7 g/dL (ref 3.5–5.0)
ALT: 15 U/L (ref 0–44)
AST: 24 U/L (ref 15–41)
Alkaline Phosphatase: 32 U/L — ABNORMAL LOW (ref 38–126)
Anion gap: 9 (ref 5–15)
BUN: 11 mg/dL (ref 6–20)
CO2: 27 mmol/L (ref 22–32)
CREATININE: 1.11 mg/dL (ref 0.61–1.24)
Calcium: 9.5 mg/dL (ref 8.9–10.3)
Chloride: 102 mmol/L (ref 98–111)
GFR calc Af Amer: >60 mL/min (ref >60)
GLUCOSE: 141 mg/dL — AB (ref 70–99)
Potassium: 3.6 mmol/L (ref 3.5–5.1)
Sodium: 138 mmol/L (ref 135–145)
Total Bilirubin: 0.8 mg/dL (ref 0.3–1.2)
Total Protein: 7.4 g/dL (ref 6.5–8.1)

## 2018-05-14 LAB — ACETAMINOPHEN LEVEL
Acetaminophen (Tylenol), Serum: <10 ug/mL — ABNORMAL LOW (ref 10–30)
Acetaminophen (Tylenol), Serum: <10 ug/mL — ABNORMAL LOW (ref 10–30)

## 2018-05-14 LAB — ETHANOL

## 2018-05-14 LAB — SALICYLATE LEVEL: Salicylate Lvl: <7 mg/dL (ref 2.8–30.0)

## 2018-05-14 MED ORDER — ESCITALOPRAM OXALATE 10 MG PO TABS: 10.0000 mg | ORAL_TABLET | Freq: Every day | ORAL | 2 refills | Status: DC

## 2018-05-14 NOTE — ED Provider Notes (Signed)
[facility] Emergency Department Provider Note  Time seen: 1:46 PM  I have reviewed the triage vital signs and the nursing notes.   HISTORY  Chief Complaint IVC    HPI Marc Booth is a 43 y.o. male with a past medical history of gastric reflux, asthma, kidney stones, presents to the emergency department after intentional overdose.  According to the patient he got into an argument this morning with his wife over "a misunderstanding."  Patient states he took around 5 to 7 tablets of naproxen in an attempt to hurt himself around 11 AM this morning but then immediately regretted the decision.  Patient denies any alcohol or drug use.  Patient denies any wish to hurt himself or anyone else at this time but does admit taking the pills earlier in frustration.  Denies any medical complaints today.  Negative review of systems.   Past Medical History:  Diagnosis Date  . Acid reflux   . Asthma   . Renal disorder    Kidney Stones    There are no active problems to display for this patient.   Past Surgical History:  Procedure Laterality Date  . EXPLORATORY LAPAROTOMY      Prior to Admission medications   Medication Sig Start Date End Date Taking? Authorizing Provider  naproxen (NAPROSYN) 500 MG tablet Take 1 tablet (500 mg total) by mouth 2 (two) times daily with a meal. Patient not taking: Reported on 02/12/2017 05/14/15   Sharman Cheek, MD  predniSONE (DELTASONE) 20 MG tablet 3 tabs po daily x 3 days, then 2 tabs x 3 days, then 1.5 tabs x 3 days, then 1 tab x 3 days, then 0.5 tabs x 3 days 11/03/17   Mesner, Barbara Cower, MD  tamsulosin (FLOMAX) 0.4 MG CAPS capsule Take 1 capsule (0.4 mg total) by mouth daily. 02/15/17   Cuthriell, Delorise Royals, PA-C  traMADol (ULTRAM) 50 MG tablet Take 1 tablet (50 mg total) by mouth every 6 (six) hours as needed. 02/15/17   Cuthriell, Delorise Royals, PA-C    Allergies  Allergen Reactions  . Onion Nausea And Vomiting  . Protonix  [Pantoprazole Sodium] Itching    No family history on file.  Social History Social History   Tobacco Use  . Smoking status: Current Every Day Smoker    Packs/day: 0.25    Years: 4.00    Pack years: 1.00  . Smokeless tobacco: Never Used  Substance Use Topics  . Alcohol use: Yes    Comment: social   . Drug use: No    Review of Systems Constitutional: Negative for fever. Cardiovascular: Negative for chest pain. Respiratory: Negative for shortness of breath. Gastrointestinal: Negative for abdominal pain Musculoskeletal: Negative for musculoskeletal complaints Skin: Negative for skin complaints  Neurological: Negative for headache All other ROS negative  ____________________________________________   PHYSICAL EXAM:  VITAL SIGNS: ED Triage Vitals  Enc Vitals Group     BP 05/14/18 1156 (!) 157/91     Pulse Rate 05/14/18 1156 (!) 102     Resp 05/14/18 1156 16     Temp 05/14/18 1156 98.2 F (36.8 C)     Temp Source 05/14/18 1156 Oral     SpO2 05/14/18 1156 99 %     Weight 05/14/18 1200 154 lb (69.9 kg)     Height 05/14/18 1200 5\' 7"  (1.702 m)     Head Circumference --      Peak Flow --      Pain Score 05/14/18 1200 0  Pain Loc --      Pain Edu? --      Excl. in GC? --    Constitutional: Alert and oriented. Well appearing and in no distress. Eyes: Normal exam ENT   Mouth/Throat: Mucous membranes are moist. Cardiovascular: Normal rate, regular rhythm. No murmur Respiratory: Normal respiratory effort without tachypnea nor retractions. Breath sounds are clear  Gastrointestinal: Soft and nontender. No distention.   Musculoskeletal: Nontender with normal range of motion in all extremities Neurologic:  Normal speech and language. No gross focal neurologic deficits Skin:  Skin is warm, dry and intact.  Psychiatric: Mood and affect are normal.   ____________________________________________   INITIAL IMPRESSION / ASSESSMENT AND PLAN / ED COURSE  Pertinent  labs & imaging results that were available during my care of the patient were reviewed by me and considered in my medical decision making (see chart for details).  Patient presents emergency department after an intentional overdose of naproxen at 11 AM after getting into an argument with his wife.  Patient does regret taking the medication.  We will check labs including a 4-hour repeat acetaminophen level just as a precaution which will be at 3 PM.  Currently the patient appears well he has no symptoms and has a normal physical examination.  Largely normal vitals besides slight hypertension/tachycardia.  Patient's lab work is largely within normal limits, urine drug screen pending.  We will maintain the IVC into the patient is seen by psychiatry.  Psychiatric evaluation and disposition pending.  ____________________________________________   FINAL CLINICAL IMPRESSION(S) / ED DIAGNOSES  Intentional overdose   Minna Antis, MD 05/14/18 1349
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
## 2018-05-14 NOTE — ED Notes (Signed)
Psychiatrist at bedside. Maintained on 15 minute checks and observation by security camera for safety. 

## 2018-05-14 NOTE — ED Notes (Signed)
EDP at bedside. Maintained on 15 minute checks and observation by security camera for safety.

## 2018-05-14 NOTE — Discharge Instructions (Addendum)
If you have any thoughts of hurting yourself or anyone else return to the emergency room.

## 2018-05-14 NOTE — ED Provider Notes (Signed)
-----------------------------------------   7:38 PM on 05/14/2018 -----------------------------------------  Medically cleared, Tylenol is negative, per signout if that is negative psychiatric disposition will be in affect.  Psychiatrist has seen him, they feel he is safe for discharge.  Patient has no SI or HI we will discharge.  Return precautions follow-up given and understood.   Jeanmarie Plant, MD 05/14/18 [phone]

## 2018-05-14 NOTE — ED Notes (Signed)
Pt dressed out. Pt belongings bag:  1 white t shirt 1 black t shirt 1 pair of black shoes 1 pair grey socks 1 brown pants 1 blue/white underwear 1 brown belt 1 grey hat 1 pink cable 1 black charging brick 1 silver colored watch 1 set of keys 5$ cash 1 black wallet 1 red cell phone 1 mouth spray  Pt allowed to keep a pair of glasses.

## 2018-05-14 NOTE — Consult Note (Signed)
[facility] Face-to-Face Psychiatry Consult   Reason for Consult: Ingestion of 5-7 Naprosyn tablets Referring Physician:  ED Patient Identification: Marc Booth MRN:  [id number] Principal Diagnosis: Naproxen overdose Diagnosis:  Principal Problem:   Naproxen overdose Active Problems:   Adjustment disorder with mixed disturbance of emotions and conduct   Panic disorder   Total Time spent with patient: 45 minutes  Subjective/HPI:   Marc Booth is a 43 y.o. male post history of GERD asthma kidney stones tobacco use disorder who presented after overdosing on about 5 to 7 tablets of naproxen about 11:00 on the morning of 05/14/2018. He states she said something he didn't like and instead of going out to walk or smoke he took the pills, not to hurt himself, states he just did it, should not have, has never done anything like that before.   He immediately regretted the ingestion of the medication and states he has no intention of hurting himself or anyone else.  After taking the pills he states he drank some vegetable oil and water and made himself vomit and he called his father who brought him to the emergency department.  He reports a history of anxiety attacks but no history of depression.  CMP CBC unremarkable acetaminophen salicylate alcohol undetectable.  He denies illicit drug use, except for trying a little marijuana a month ago when he was on vacation no drug screen obtained.  Psychiatric intake assessment therapist spoke with his wife who agrees that this was an attention seeking measure and that he is okay to come home.  Patient reports that his father is told him that he recommends that they get counseling as a couple.  The patient would also like to start treatment for anxiety episodes he states he has panic attacks off and on and has been thinking of trying some medication for those, he denies actual depression.    Past Psychiatric History: He reports anxiety attacks  Risk to Self: Suicidal  Ideation: No Suicidal Intent: No Is patient at risk for suicide?: No Suicidal Plan?: No Access to Means: No What has been your use of drugs/alcohol within the last 12 months?: Cannabis How many times?: 0 Other Self Harm Risks: Reports of none Triggers for Past Attempts: None known Intentional Self Injurious Behavior: None Risk to Others: Homicidal Ideation: No Thoughts of Harm to Others: No Current Homicidal Intent: No Current Homicidal Plan: No Access to Homicidal Means: No Identified Victim: Reports of none History of harm to others?: No Assessment of Violence: None Noted Violent Behavior Description: Reports of none Does patient have access to weapons?: No Criminal Charges Pending?: No Does patient have a court date: No Prior Inpatient Therapy: Prior Inpatient Therapy: No Prior Outpatient Therapy: Prior Outpatient Therapy: No Does patient have an ACCT team?: No Does patient have Intensive In-House Services?  : No Does patient have [facility] services? : No Does patient have P4CC services?: No  Past Medical History:  Past Medical History:  Diagnosis Date  . Acid reflux   . Asthma   . Renal disorder    Kidney Stones    Past Surgical History:  Procedure Laterality Date  . EXPLORATORY LAPAROTOMY     Family History: No family history on file.  Social History:  Social History   Substance and Sexual Activity  Alcohol Use Yes   Comment: social      Social History   Substance and Sexual Activity  Drug Use No    Social History   Socioeconomic History  .  Marital status: Single    Spouse name: Not on file  . Number of children: Not on file  . Years of education: Not on file  . Highest education level: Not on file  Occupational History  . Not on file  Social Needs  . Financial resource strain: Not on file  . Food insecurity:    Worry: Not on file    Inability: Not on file  . Transportation needs:    Medical: Not on file    Non-medical: Not on file  Tobacco  Use  . Smoking status: Current Every Day Smoker    Packs/day: 0.25    Years: 4.00    Pack years: 1.00  . Smokeless tobacco: Never Used  Substance and Sexual Activity  . Alcohol use: Yes    Comment: social   . Drug use: No  . Sexual activity: Yes  Lifestyle  . Physical activity:    Days per week: Not on file    Minutes per session: Not on file  . Stress: Not on file  Relationships  . Social connections:    Talks on phone: Not on file    Gets together: Not on file    Attends religious service: Not on file    Active member of club or organization: Not on file    Attends meetings of clubs or organizations: Not on file    Relationship status: Not on file  Other Topics Concern  . Not on file  Social History Narrative  . Not on file   Additional Social History:    Allergies:   Allergies  Allergen Reactions  . Onion Nausea And Vomiting  . Protonix [Pantoprazole Sodium] Itching    Labs:  Results for orders placed or performed during the hospital encounter of 05/14/18 (from the past 48 hour(s))  Comprehensive metabolic panel     Status: Abnormal   Collection Time: 05/14/18 12:04 PM  Result Value Ref Range   Sodium 138 135 - 145 mmol/L   Potassium 3.6 3.5 - 5.1 mmol/L   Chloride 102 98 - 111 mmol/L   CO2 27 22 - 32 mmol/L   Glucose, Bld 141 (H) 70 - 99 mg/dL   BUN 11 6 - 20 mg/dL   Creatinine, Ser 9.21 0.61 - 1.24 mg/dL   Calcium 9.5 8.9 - 19.4 mg/dL   Total Protein 7.4 6.5 - 8.1 g/dL   Albumin 4.7 3.5 - 5.0 g/dL   AST 24 15 - 41 U/L   ALT 15 0 - 44 U/L   Alkaline Phosphatase 32 (L) 38 - 126 U/L   Total Bilirubin 0.8 0.3 - 1.2 mg/dL   GFR calc non Af Amer >60 >60 mL/min   GFR calc Af Amer >60 >60 mL/min   Anion gap 9 5 - 15    Comment: Performed at [facility], [address] [place], [place] [address]  Ethanol     Status: None   Collection Time: 05/14/18 12:04 PM  Result Value Ref Range   Alcohol, Ethyl (B) <10 <10 mg/dL    Comment: (NOTE) Lowest  detectable limit for serum alcohol is 10 mg/dL. For medical purposes only. Performed at [facility], [address] [place], [place] [address]   Salicylate level     Status: None   Collection Time: 05/14/18 12:04 PM  Result Value Ref Range   Salicylate Lvl <7.0 2.8 - 30.0 mg/dL    Comment: Performed at [facility], [address] [place], [place]  [id number]  Acetaminophen level     Status: Abnormal   Collection Time: 05/14/18 12:04 PM  Result Value Ref Range   Acetaminophen (Tylenol), Serum <10 (L) 10 - 30 ug/mL    Comment: (NOTE) Therapeutic concentrations vary significantly. A range of 10-30 ug/mL  may be an effective concentration for many patients. However, some  are best treated at concentrations outside of this range. Acetaminophen concentrations >150 ug/mL at 4 hours after ingestion  and >50 ug/mL at 12 hours after ingestion are often associated with  toxic reactions. Performed at [facility], [address] [place], [place] [address]   cbc     Status: None   Collection Time: 05/14/18 12:04 PM  Result Value Ref Range   WBC 7.1 4.0 - 10.5 K/uL   RBC 5.14 4.22 - 5.81 MIL/uL   Hemoglobin 15.3 13.0 - 17.0 g/dL   HCT 09.846.1 11.939.0 - 14.752.0 %   MCV 89.7 80.0 - 100.0 fL   MCH 29.8 26.0 - 34.0 pg   MCHC 33.2 30.0 - 36.0 g/dL   RDW 82.913.4 56.211.5 - 13.015.5 %   Platelets 233 150 - 400 K/uL   nRBC 0.0 0.0 - 0.2 %    Comment: Performed at [facility], [address] [place], [place] [address]  Acetaminophen level     Status: Abnormal   Collection Time: 05/14/18  4:57 PM  Result Value Ref Range   Acetaminophen (Tylenol), Serum <10 (L) 10 - 30 ug/mL    Comment: (NOTE) Therapeutic concentrations vary significantly. A range of 10-30 ug/mL  may be an effective concentration for many patients. However, some  are best treated at concentrations outside of this range. Acetaminophen concentrations >150 ug/mL at 4 hours after ingestion  and >50 ug/mL at  12 hours after ingestion are often associated with  toxic reactions. Performed at [facility], [address] [place], [place] [address]     No current facility-administered medications for this encounter.    Current Outpatient Medications  Medication Sig Dispense Refill  . naproxen (NAPROSYN) 500 MG tablet Take 1 tablet (500 mg total) by mouth 2 (two) times daily with a meal. (Patient not taking: Reported on 02/12/2017) 20 tablet 0  . predniSONE (DELTASONE) 20 MG tablet 3 tabs po daily x 3 days, then 2 tabs x 3 days, then 1.5 tabs x 3 days, then 1 tab x 3 days, then 0.5 tabs x 3 days 27 tablet 0  . tamsulosin (FLOMAX) 0.4 MG CAPS capsule Take 1 capsule (0.4 mg total) by mouth daily. 30 capsule 0  . traMADol (ULTRAM) 50 MG tablet Take 1 tablet (50 mg total) by mouth every 6 (six) hours as needed. 15 tablet 0    Musculoskeletal: Strength & Muscle Tone: within normal limits Gait & Station: normal Patient leans: N/A  Psychiatric Specialty Exam: Physical Exam  ROS  Blood pressure 124/82, pulse 73, temperature 98.2 F (36.8 C), temperature source Oral, resp. rate 16, height 5\' 7"  (1.702 m), weight 69.9 kg, SpO2 98 %.Body mass index is 24.12 kg/m.  General Appearance: Well Groomed  Eye Contact:  Good  Speech:  Clear and Coherent  Volume:  Normal  Mood:  Anxious  Affect:  Congruent  Thought Process:  Coherent  Orientation:  Full (Time, Place, and Person)  Thought Content:  Logical  Suicidal Thoughts:  No  Homicidal Thoughts:  No  Memory:  Immediate;   Good Recent;   Good Remote;   Good  Judgement:  Good  Insight:  Good  Psychomotor Activity:  Normal  Concentration:  Concentration: Good and Attention Span: Good  Recall:  Good  Fund of Knowledge:  Good  Language:  Good  Akathisia:  No  Handed:  Right  AIMS (if indicated):     Assets:  Communication Skills Desire for Improvement Social Support  ADL's:  Intact  Cognition:  WNL  Sleep:        Treatment  Plan Summary: Plan Lexapro 10 mg tablet 1/2 tablet daily for 4 days then 1 tablet daily for panic disorder, discharged to family, follow-up with [facility] for outpatient therapy and medication management  Disposition: No evidence of imminent risk to self or others at present.   Patient does not meet criteria for psychiatric inpatient admission. Discussed crisis plan, support from social network, calling 911, coming to the Emergency Department, and calling Suicide Hotline. Follow-up at [facility], MD 05/14/2018 6:29 PM

## 2018-05-14 NOTE — ED Notes (Signed)
TTS at bedside. Maintained on 15 minute checks and observation by security camera for safety. 

## 2018-05-14 NOTE — ED Triage Notes (Signed)
Pt to ED via [employer]. Pt states that he and his wife got into an argument. Pt took 5-6 naproxen pills. Pt states that he drink some vegetable oil and water and made himself vomit after taking the medication. Pt states that he was upset and that is why he took the medication. Pt states that he was not attempting to harm himself, he was just upset over what his wife said. Pt states that he does not have a hx/o depression, has had anxiety attacks in the past but they do not require treatment. Pt is calm and cooperative in triage at this time. Pt is in NAD.   Pt denies thoughts of wanting to harm himself or anyone else.

## 2018-05-14 NOTE — ED Notes (Signed)
Pt given a food tray and drink. Pt given hand sanitizer.

## 2018-05-14 NOTE — ED Notes (Signed)
Pt. Going home with friend 

## 2019-01-05 ENCOUNTER — Emergency Department
Admission: EM | Admit: 2019-01-05 | Discharge: 2019-01-05 | Disposition: A | Discharge status: Home / Self Care | Payer: Self-pay | Attending: Emergency Medicine | Admitting: Emergency Medicine

## 2019-01-05 ENCOUNTER — Other Ambulatory Visit: Payer: Self-pay

## 2019-01-05 ENCOUNTER — Encounter: Payer: Self-pay | Admitting: Emergency Medicine

## 2019-01-05 DIAGNOSIS — L03116 Cellulitis of left lower limb: Secondary | ICD-10-CM | POA: Insufficient documentation

## 2019-01-05 DIAGNOSIS — Z79899 Other long term (current) drug therapy: Secondary | ICD-10-CM | POA: Insufficient documentation

## 2019-01-05 DIAGNOSIS — J45909 Unspecified asthma, uncomplicated: Secondary | ICD-10-CM | POA: Insufficient documentation

## 2019-01-05 DIAGNOSIS — F17210 Nicotine dependence, cigarettes, uncomplicated: Secondary | ICD-10-CM | POA: Insufficient documentation

## 2019-01-05 MED ORDER — SULFAMETHOXAZOLE-TRIMETHOPRIM 800-160 MG PO TABS: 1.0000 | ORAL_TABLET | Freq: Two times a day (BID) | ORAL | 0 refills | Status: AC

## 2019-01-05 MED ORDER — CEPHALEXIN 500 MG PO CAPS: 500.0000 mg | ORAL_CAPSULE | Freq: Three times a day (TID) | ORAL | 0 refills | Status: AC

## 2019-01-05 NOTE — Discharge Instructions (Signed)
Take Bactrim twice daily for the next 7 days. Take Keflex 3 times daily for the next 7 days. Apply warm compresses to affected area and gently exfoliate scab in affected area. Return to the emergency department with worsening redness of the left thigh.

## 2019-01-05 NOTE — ED Triage Notes (Signed)
Pt reports has a bump on his thigh that has been there for 3 days and it is painful. Pt reports area is red.

## 2019-01-05 NOTE — ED Notes (Signed)
Pt with red raised area on left lower thigh. Area is tender to tough and hard. Pt states pain is worse when he moves it. Started on Monday.

## 2019-01-05 NOTE — ED Provider Notes (Signed)
[facility] Emergency Department Provider Note  ____________________________________________  Time seen: Approximately 3:40 PM  I have reviewed the triage vital signs and the nursing notes.   HISTORY  Chief Complaint Abscess and Leg Pain    HPI Marc Booth is a 43 y.o. male presents to the emergency department with concern for left upper thigh redness and pain that has been present for the past 4 to 5 days.  Patient reports that affected area originally looked like an insect bite and erythema has since spread.  He denies fever and chills.  Denies any history of cutaneous skin infections in the past. No other alleviating measures have been attempted.         Past Medical History:  Diagnosis Date  . Acid reflux   . Asthma   . Renal disorder    Kidney Stones    Patient Active Problem List   Diagnosis Date Noted  . 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 Naproxen overdose 05/14/2018  . Panic disorder 05/14/2018    Past Surgical History:  Procedure Laterality Date  . EXPLORATORY LAPAROTOMY      Prior to Admission medications   Medication Sig Start Date End Date Taking? Authorizing Provider  cephALEXin (KEFLEX) 500 MG capsule Take 1 capsule (500 mg total) by mouth 3 (three) times daily for 7 days. 01/05/19 01/12/19  Lannie Fields, PA-C  escitalopram (LEXAPRO) 10 MG tablet Take 1 tablet (10 mg total) by mouth daily. Start with 1/2 tablet daily for 4 days then 1 tablet daily 05/14/18 05/14/19  Patience Musca, MD  sulfamethoxazole-trimethoprim (BACTRIM DS) 800-160 MG tablet Take 1 tablet by mouth 2 (two) times daily for 7 days. 01/05/19 01/12/19  Lannie Fields, PA-C  tamsulosin (FLOMAX) 0.4 MG CAPS capsule Take 1 capsule (0.4 mg total) by mouth daily. 02/15/17   Cuthriell, Charline Bills, PA-C    Allergies Onion and Protonix [pantoprazole sodium]  No family history on file.  Social History Social  History   Tobacco Use  . Smoking status: Current Every Day Smoker    Packs/day: 0.25    Years: 4.00    Pack years: 1.00  . Smokeless tobacco: Never Used  Substance Use Topics  . Alcohol use: Yes    Comment: social   . Drug use: No     Review of Systems  Constitutional: No fever/chills Eyes: No visual changes. No discharge ENT: No upper respiratory complaints. Cardiovascular: no chest pain. Respiratory: no cough. No SOB. Gastrointestinal: No abdominal pain.  No nausea, no vomiting.  No diarrhea.  No constipation. Musculoskeletal: Negative for musculoskeletal pain. Skin: Patient has erythema and induration of left thigh. Neurological: Negative for headaches, focal weakness or numbness.   ____________________________________________   PHYSICAL EXAM:  VITAL SIGNS: ED Triage Vitals  Enc Vitals Group     BP 01/05/19 1458 107/70     Pulse Rate 01/05/19 1458 75     Resp 01/05/19 1458 16     Temp 01/05/19 1458 98.2 F (36.8 C)     Temp Source 01/05/19 1458 Oral     SpO2 01/05/19 1458 96 %     Weight 01/05/19 1455 156 lb (70.8 kg)     Height 01/05/19 1455 5\' 6"  (1.676 m)     Head Circumference --      Peak Flow --      Pain Score 01/05/19 1455 5     Pain Loc --      Pain  Edu? --      Excl. in GC? --      Constitutional: Alert and oriented. Well appearing and in no acute distress. Eyes: Conjunctivae are normal. PERRL. EOMI. Head: Atraumatic. ENT: Cardiovascular: Normal rate, regular rhythm. Normal S1 and S2.  Good peripheral circulation. Respiratory: Normal respiratory effort without tachypnea or retractions. Lungs CTAB. Good air entry to the bases with no decreased or absent breath sounds. Gastrointestinal: Bowel sounds 4 quadrants. Soft and nontender to palpation. No guarding or rigidity. No palpable masses. No distention. No CVA tenderness.* Musculoskeletal: Full range of motion to all extremities. No gross deformities appreciated. Neurologic:  Normal speech and  language. No gross focal neurologic deficits are appreciated.  Skin: Patient has a 4 cm x 4 cm circumferential region of erythema along the left upper thigh with associated induration but no palpable fluctuance.  No streaking. Psychiatric: Mood and affect are normal. Speech and behavior are normal. Patient exhibits appropriate insight and judgement.   ____________________________________________   LABS (all labs ordered are listed, but only abnormal results are displayed)  Labs Reviewed - No data to display ____________________________________________  EKG   ____________________________________________  RADIOLOGY   No results found.  ____________________________________________    PROCEDURES  Procedure(s) performed:    Procedures    Medications - No data to display   ____________________________________________   INITIAL IMPRESSION / ASSESSMENT AND PLAN / ED COURSE  Pertinent labs & imaging results that were available during my care of the patient were reviewed by me and considered in my medical decision making (see chart for details).  Review of the [place] CSRS was performed in accordance of the [facility] prior to dispensing any controlled drugs.           Assessment and plan Cellulitis 43 year old male presents to the emergency department with a 4 cm x 4 cm region of left upper thigh cellulitis.  Vital signs were reassuring at triage.  There was no appreciable fluctuance to palpation that would suggest abscess.  Patient was started empirically on both Bactrim and Keflex and was advised to apply warm compresses to the affected area.  Return precautions were given to return if symptoms do not improve or worsen.  Patient has easy access to the emergency department should symptoms worsen.  All patient questions were answered.   ____________________________________________  FINAL CLINICAL IMPRESSION(S) / ED DIAGNOSES  Final diagnoses:  Cellulitis of left lower  extremity      NEW MEDICATIONS STARTED DURING THIS VISIT:  ED Discharge Orders         Ordered    sulfamethoxazole-trimethoprim (BACTRIM DS) 800-160 MG tablet  2 times daily     01/05/19 1535    cephALEXin (KEFLEX) 500 MG capsule  3 times daily     01/05/19 1535              This chart was dictated using voice recognition software/Dragon. Despite best efforts to proofread, errors can occur which can change the meaning. Any change was purely unintentional.    Orvil Feil, PA-C 01/05/19 1556    Shaune Pollack, MD 01/06/19 [phone]

## 2019-02-03 ENCOUNTER — Emergency Department: Payer: Self-pay

## 2019-02-03 ENCOUNTER — Emergency Department
Admission: EM | Admit: 2019-02-03 | Discharge: 2019-02-03 | Disposition: A | Discharge status: Home / Self Care | Payer: Self-pay | Attending: Emergency Medicine | Admitting: Emergency Medicine

## 2019-02-03 ENCOUNTER — Other Ambulatory Visit: Payer: Self-pay

## 2019-02-03 DIAGNOSIS — S9782XA Crushing injury of left foot, initial encounter: Secondary | ICD-10-CM | POA: Insufficient documentation

## 2019-02-03 DIAGNOSIS — Y92014 Private driveway to single-family (private) house as the place of occurrence of the external cause: Secondary | ICD-10-CM | POA: Insufficient documentation

## 2019-02-03 DIAGNOSIS — F17210 Nicotine dependence, cigarettes, uncomplicated: Secondary | ICD-10-CM | POA: Insufficient documentation

## 2019-02-03 DIAGNOSIS — W230XXA Caught, crushed, jammed, or pinched between moving objects, initial encounter: Secondary | ICD-10-CM | POA: Insufficient documentation

## 2019-02-03 DIAGNOSIS — Y998 Other external cause status: Secondary | ICD-10-CM | POA: Insufficient documentation

## 2019-02-03 DIAGNOSIS — Y9389 Activity, other specified: Secondary | ICD-10-CM | POA: Insufficient documentation

## 2019-02-03 DIAGNOSIS — Z79899 Other long term (current) drug therapy: Secondary | ICD-10-CM | POA: Insufficient documentation

## 2019-02-03 DIAGNOSIS — J45909 Unspecified asthma, uncomplicated: Secondary | ICD-10-CM | POA: Insufficient documentation

## 2019-02-03 MED ORDER — TRAMADOL HCL 50 MG PO TABS
50.0000 mg | ORAL_TABLET | Freq: Once | ORAL | Status: AC
  Administered 2019-02-03: 50 mg via ORAL
  Filled 2019-02-03: qty 1

## 2019-02-03 MED ORDER — TRAMADOL HCL 50 MG PO TABS: 50.0000 mg | ORAL_TABLET | Freq: Four times a day (QID) | ORAL | 0 refills | Status: DC | PRN
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
## 2019-02-03 NOTE — ED Notes (Signed)
Ace wrap to right foot

## 2019-02-03 NOTE — Discharge Instructions (Signed)
Rest, ice, and elevate your right foot often over the next several days.  Follow-up with the podiatrist if not improving over the week.  Return to the emergency department for symptoms of change or worsen if you are unable to schedule appointment.

## 2019-09-11 ENCOUNTER — Emergency Department
Admission: EM | Admit: 2019-09-11 | Discharge: 2019-09-11 | Disposition: A | Discharge status: Home / Self Care | Payer: BC Managed Care – PPO | Attending: Emergency Medicine | Admitting: Emergency Medicine

## 2019-09-11 ENCOUNTER — Encounter: Payer: Self-pay | Admitting: Emergency Medicine

## 2019-09-11 ENCOUNTER — Other Ambulatory Visit: Payer: Self-pay

## 2019-09-11 DIAGNOSIS — J45909 Unspecified asthma, uncomplicated: Secondary | ICD-10-CM | POA: Insufficient documentation

## 2019-09-11 DIAGNOSIS — F17200 Nicotine dependence, unspecified, uncomplicated: Secondary | ICD-10-CM | POA: Insufficient documentation

## 2019-09-11 DIAGNOSIS — K0889 Other specified disorders of teeth and supporting structures: Secondary | ICD-10-CM | POA: Diagnosis not present

## 2019-09-11 MED ORDER — AMOXICILLIN 500 MG PO CAPS: 500.0000 mg | ORAL_CAPSULE | Freq: Three times a day (TID) | ORAL | 0 refills | Status: DC

## 2019-09-11 MED ORDER — NAPROXEN 500 MG PO TABS: 500.0000 mg | ORAL_TABLET | Freq: Two times a day (BID) | ORAL | 2 refills | Status: AC

## 2019-09-11 MED ORDER — TRAMADOL HCL 50 MG PO TABS: 50.0000 mg | ORAL_TABLET | Freq: Four times a day (QID) | ORAL | 0 refills | Status: DC | PRN

## 2019-09-11 NOTE — Discharge Instructions (Addendum)
OPTIONS FOR DENTAL FOLLOW UP CARE ° °[place] Department of Health and Human Services - Local Safety Net Dental Clinics °http://www.ncdhhs.gov/dph/oralhealth/services/safetynetclinics.htm °  °[facility] (336-562-3123) ° °[facility] [place] ([phone]) ° °[facility] ([phone] ext [phone]) ° °[facility] ([phone]) ° °[facility] ([phone]) °This clinic caters to the indigent population and is on a lottery system. °Location: °[facility], [address], [place] °Clinic Hours: °Wednesdays from 6pm - 9pm, patients seen by a lottery system. °For dates, call or go to www.med.[facility].edu/[facility]/patients/Dental-[facility] °Services: °Cleanings, fillings and simple extractions. °Payment Options: °DENTAL WORK IS FREE OF CHARGE. Bring proof of income or support. °Best way to get seen: °Arrive at 5:15 pm - this is a lottery, NOT first come/first serve, so arriving earlier will not increase your chances of being seen. °  °  °[facility] °[phone] °Select option 1 for emergencies °  °Location: °[facility], [address], [place] °Clinic Hours: °No walk-ins accepted - call the day before to schedule an appointment. °Check in times are 9:30 am and 1:30 pm. °Services: °Simple extractions, temporary fillings, pulpectomy/pulp debridement, uncomplicated abscess drainage. °Payment Options: °PAYMENT IS DUE AT THE TIME OF SERVICE.  Fee is usually $100-200, additional surgical procedures (e.g. abscess drainage) may be extra. °Cash, checks, Visa/MasterCard accepted.  Can file Medicaid if patient is covered for dental - patient should call case worker to check. °No discount for [facility] patients. °Best way to get seen: °MUST call the day before and get onto the schedule. Can usually be seen the next 1-2 days. No walk-ins accepted. °  °  °[facility] °[phone] °   °Location: °[facility], [address], [place] °Clinic Hours: °M, W, Th, F 8am or 1:30pm, Tues 9a or 1:30 - first come/first served. °Services: °Simple extractions, temporary fillings, uncomplicated abscess drainage.  You do not need to be an [place] resident. °Payment Options: °PAYMENT IS DUE AT THE TIME OF SERVICE. °Dental insurance, otherwise sliding scale - bring proof of income or support. °Depending on income and treatment needed, cost is usually $50-200. °Best way to get seen: °Arrive early as it is first come/first served. °  °  °[facility] °[phone] °  °Location: °[address] °Clinic Hours: °Mon-Thu 8a-5p °Services: °Most basic dental services including extractions and fillings. °Payment Options: °PAYMENT IS DUE AT THE TIME OF SERVICE. °Sliding scale, up to 50% off - bring proof if income or support. °Medicaid with dental option accepted. °Best way to get seen: °Call to schedule an appointment, can usually be seen within 2 weeks OR they will try to see walk-ins - show up at 8a or 2p (you may have to wait). °  °  °[facility] °919-245-2435 °[place] RESIDENTS ONLY °  °Location: °[facility], [address], [place], [place] [address] °Clinic Hours: By appointment only. °Monday - Thursday 8am-5pm, Friday 8am-12pm °Services: Cleanings, fillings, extractions. °Payment Options: °PAYMENT IS DUE AT THE TIME OF SERVICE. °Cash, Visa or MasterCard. Sliding scale - $30 minimum per service. °Best way to get seen: °Come in to office, complete packet and make an appointment - need proof of income °or support monies for each household member and proof of [place] residence. °Usually takes about a month to get in. °  °  °[facility] °[phone] °  °Location: °[address]   [place] °Clinic Hours: Walk-in Urgent Care Dental Services are offered Monday-Friday  mornings only. °The numbers of emergencies accepted daily is limited to the number of °providers available. °Maximum 15 - Mondays, Wednesdays & Thursdays °Maximum 10 - Tuesdays & Fridays °Services: °You do not need to be a [place] resident to be seen for a dental emergency. °Emergencies are defined as pain, swelling, abnormal bleeding, or dental trauma. Walkins will receive x-rays if needed. °NOTE: Dental cleaning is not an emergency. °Payment Options: °PAYMENT IS DUE AT THE TIME OF SERVICE. °Minimum co-pay is $40.00 for uninsured patients. °Minimum co-pay is $3.00 for Medicaid with dental coverage. °Dental Insurance is accepted and must be presented at time of visit. °Medicare does not cover dental. °Forms of payment: Cash, credit card, checks. °Best way to get seen: °If not previously registered with the clinic, walk-in dental registration begins at 7:15 am and is on a first come/first serve basis. °If previously registered with the clinic, call to make an appointment. °  °  °[facility] °[phone] °[place] RESIDENTS ONLY °  °Location: °[address], [place], [place] °Clinic Hours: °Mon-Thu 10a-2p °Services: Extractions only! °Payment Options: °FREE (donations accepted) - bring proof of income or support °Best way to get seen: °Call and schedule an appointment OR come at 8am on the 1st Monday of every month (except for holidays) when it is first come/first served. °  °  °[facility] °[phone] °  °Location: °[address], [place] °Clinic Hours: °Friday mornings °Services, Payment Options, Best way to get seen: °Call for info °

## 2019-09-11 NOTE — ED Provider Notes (Signed)
[facility] Emergency Department Provider Note  ____________________________________________   First MD Initiated Contact with Patient 09/11/19 1348     (approximate)  I have reviewed the triage vital signs and the nursing notes.   HISTORY  Chief Complaint Dental Pain    HPI Marc Booth is a 44 y.o. male presents emergency department complaining of lower left-sided tooth pain.  States he has also had difficulty sleeping since his father passed.  No fever or chills.  No pain to the jaw.  States he is waiting on his dental card to come in from his employer.  No chest pain or shortness of breath.    Past Medical History:  Diagnosis Date  . Acid reflux   . Asthma   . Renal disorder    Kidney Stones    Patient Active Problem List   Diagnosis Date Noted  . 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 Naproxen overdose 05/14/2018  . Panic disorder 05/14/2018    Past Surgical History:  Procedure Laterality Date  . EXPLORATORY LAPAROTOMY      Prior to Admission medications   Medication Sig Start Date End Date Taking? Authorizing Provider  amoxicillin (AMOXIL) 500 MG capsule Take 1 capsule (500 mg total) by mouth 3 (three) times daily. 09/11/19   Junius Faucett, Roselyn Bering, PA-C  escitalopram (LEXAPRO) 10 MG tablet Take 1 tablet (10 mg total) by mouth daily. Start with 1/2 tablet daily for 4 days then 1 tablet daily 05/14/18 05/14/19  Terance Hart, MD  naproxen (NAPROSYN) 500 MG tablet Take 1 tablet (500 mg total) by mouth 2 (two) times daily with a meal. 09/11/19 09/10/20  Emmamarie Kluender, Roselyn Bering, PA-C  tamsulosin (FLOMAX) 0.4 MG CAPS capsule Take 1 capsule (0.4 mg total) by mouth daily. 02/15/17   Cuthriell, Delorise Royals, PA-C  traMADol (ULTRAM) 50 MG tablet Take 1 tablet (50 mg total) by mouth every 6 (six) hours as needed. 09/11/19   Sherrie Mustache Roselyn Bering, PA-C    Allergies Onion and Protonix [pantoprazole sodium]  No family  history on file.  Social History Social History   Tobacco Use  . Smoking status: Current Every Day Smoker    Packs/day: 0.25    Years: 4.00    Pack years: 1.00  . Smokeless tobacco: Never Used  Substance Use Topics  . Alcohol use: Yes    Comment: social   . Drug use: No    Review of Systems  Constitutional: No fever/chills Eyes: No visual changes. ENT: No sore throat.  Positive for Respiratory: Denies cough Cardiovascular: Denies chest pain Genitourinary: Negative for dysuria. Musculoskeletal: Negative for back pain. Skin: Negative for rash. Psychiatric: no mood changes,     ____________________________________________   PHYSICAL EXAM:  VITAL SIGNS: ED Triage Vitals  Enc Vitals Group     BP 09/11/19 1320 108/75     Pulse Rate 09/11/19 1320 81     Resp 09/11/19 1320 16     Temp 09/11/19 1320 98.6 F (37 C)     Temp Source 09/11/19 1320 Oral     SpO2 09/11/19 1320 97 %     Weight 09/11/19 1318 156 lb 1.4 oz (70.8 kg)     Height 09/11/19 1318 5\' 6"  (1.676 m)     Head Circumference --      Peak Flow --      Pain Score 09/11/19 1318 8     Pain Loc --      Pain Edu? --  Excl. in GC? --     Constitutional: Alert and oriented. Well appearing and in no acute distress. Eyes: Conjunctivae are normal.  Head: Atraumatic. Nose: No congestion/rhinnorhea. Mouth/Throat: Mucous membranes are moist.  Poor dentition noted Neck:  supple no lymphadenopathy noted Cardiovascular: Normal rate, regular rhythm. Heart sounds are normal Respiratory: Normal respiratory effort.  No retractions, lungs c t a  GU: deferred Musculoskeletal: FROM all extremities, warm and well perfused Neurologic:  Normal speech and language.  Skin:  Skin is warm, dry and intact. No rash noted. Psychiatric: Mood and affect are normal. Speech and behavior are normal.  ____________________________________________   LABS (all labs ordered are listed, but only abnormal results are  displayed)  Labs Reviewed - No data to display ____________________________________________   ____________________________________________  RADIOLOGY    ____________________________________________   PROCEDURES  Procedure(s) performed: No  Procedures    ____________________________________________   INITIAL IMPRESSION / ASSESSMENT AND PLAN / ED COURSE  Pertinent labs & imaging results that were available during my care of the patient were reviewed by me and considered in my medical decision making (see chart for details).   Patient's 44 year old male presents emergency department with chronic dental pain.  See HPI.  Physical exam shows poor dentition.  Had a long discussion with patient concerning his dental care.  He states he is waiting for his dental card for his employer.  He was given prescription for amoxicillin, naproxen, and tramadol.  Follow-up this regular physician as needed.  A list of dental clinics was provided for him.  He is discharged stable condition.      As part of my medical decision making, I reviewed the following data within the electronic MEDICAL RECORD NUMBER Nursing notes reviewed and incorporated, Old chart reviewed, Notes from prior ED visits and [place] Controlled Substance Database  ____________________________________________   FINAL CLINICAL IMPRESSION(S) / ED DIAGNOSES  Final diagnoses:  Pain, dental      NEW MEDICATIONS STARTED DURING THIS VISIT:  New Prescriptions   AMOXICILLIN (AMOXIL) 500 MG CAPSULE    Take 1 capsule (500 mg total) by mouth 3 (three) times daily.   NAPROXEN (NAPROSYN) 500 MG TABLET    Take 1 tablet (500 mg total) by mouth 2 (two) times daily with a meal.   TRAMADOL (ULTRAM) 50 MG TABLET    Take 1 tablet (50 mg total) by mouth every 6 (six) hours as needed.     Note:  This document was prepared using Dragon voice recognition software and may include unintentional dictation errors.    Faythe Ghee,  PA-C 09/11/19 1409    Sharyn Creamer, MD 09/11/19 [phone]

## 2019-09-11 NOTE — ED Triage Notes (Signed)
Left lower jaw dental pain x2 days.

## 2019-09-11 NOTE — ED Notes (Signed)
Pt states pain to back lower left side of mouth. Pt states this has been going on for 2 weeks.

## 2019-10-09 ENCOUNTER — Emergency Department
Admission: EM | Admit: 2019-10-09 | Discharge: 2019-10-09 | Discharge status: Against Medical Advice | Payer: BC Managed Care – PPO

## 2020-04-24 ENCOUNTER — Other Ambulatory Visit: Payer: Self-pay

## 2020-04-24 ENCOUNTER — Ambulatory Visit: Payer: Self-pay | Admitting: Physician Assistant

## 2020-04-24 ENCOUNTER — Encounter: Payer: Self-pay | Admitting: Physician Assistant

## 2020-04-24 DIAGNOSIS — Z113 Encounter for screening for infections with a predominantly sexual mode of transmission: Secondary | ICD-10-CM

## 2020-04-24 LAB — GRAM STAIN

## 2020-04-24 NOTE — Progress Notes (Signed)
Patient here for std screening and wants hepatitis testing.   Harvie Heck, RN   Post:  RN reviewed gram stain with patient. No tx per S.O. Patient given PCP list. Provider orders complete.   Harvie Heck, RN

## 2020-04-24 NOTE — Progress Notes (Signed)
[facility] Department STI clinic/screening visit  Subjective:  Marc Booth is a 45 y.o. male being seen today for an STI screening visit. The patient reports they do not have symptoms.    Patient has the following medical conditions:   Patient Active Problem List   Diagnosis Date Noted  . 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 Naproxen overdose 05/14/2018  . Panic disorder 05/14/2018     Chief Complaint  Patient presents with  . SEXUALLY TRANSMITTED DISEASE    screening    HPI  Patient reports that he is not having any symptoms but would like a screening.  Reports that his wife has Hepatitis B and he would like screening for this today.  States that he has panic attacks that he sometimes takes medicine for but does not take it regularly.  Denies regular medicines and chronic conditions except panic attacks.  States that it has been "a while" since his last HIV test and around 2 hr since last void prior to sample collection for Gram stain.   See flowsheet for further details and programmatic requirements.    The following portions of the patient's history were reviewed and updated as appropriate: allergies, current medications, past medical history, past social history, past surgical history and problem list.  Objective:  There were no vitals filed for this visit.  Physical Exam Constitutional:      General: He is not in acute distress.    Appearance: Normal appearance.  HENT:     Head: Normocephalic and atraumatic.     Comments: No nits,lice, or hair loss. No cervical, supraclavicular or axillary adenopathy.    Mouth/Throat:     Mouth: Mucous membranes are moist.     Pharynx: Oropharynx is clear. No oropharyngeal exudate or posterior oropharyngeal erythema.  Eyes:     Conjunctiva/sclera: Conjunctivae normal.  Pulmonary:     Effort: Pulmonary effort is normal.  Abdominal:     Palpations: Abdomen is soft.  There is no mass.     Tenderness: There is no abdominal tenderness. There is no guarding or rebound.  Genitourinary:    Penis: Normal.      Testes: Normal.     Comments: Pubic area without nits, lice, hair loss, edema, erythema, lesions and inguinal adenopathy. Penis circumcised without rash, lesions and discharge at meatus. Musculoskeletal:     Cervical back: Neck supple. No tenderness.  Skin:    General: Skin is warm and dry.     Findings: No bruising, erythema, lesion or rash.  Neurological:     Mental Status: He is alert and oriented to person, place, and time.  Psychiatric:        Mood and Affect: Mood normal.        Behavior: Behavior normal.        Thought Content: Thought content normal.        Judgment: Judgment normal.       Assessment and Plan:  Marc Booth is a 45 y.o. male presenting to the [facility] Department for STI screening  1. Screening for STD (sexually transmitted disease) Patient into clinic without symptoms. Counseled patient that he should get established with PCP for age appropriate screenings and to monitor panic attacks and medicines for patient.  RN to give patient PCP list to establish care. Rec condoms with all sex. Await test results.  Counseled that RN will call if needs to RTC for treatment once results are back. -  Gram stain - Gonococcus culture - HBV Antigen/Antibody State Lab - HIV/HCV [place] [facility] - Syphilis Serology, [facility]     No follow-ups on file.  No future appointments.  Matt Holmes, PA

## 2020-04-29 LAB — GONOCOCCUS CULTURE

## 2020-04-30 LAB — HEPATITIS B SURFACE ANTIGEN

## 2020-04-30 LAB — HM HEPATITIS C SCREENING LAB: HM Hepatitis Screen: NEGATIVE

## 2020-04-30 LAB — HM HIV SCREENING LAB: HM HIV Screening: NEGATIVE

## 2020-05-01 ENCOUNTER — Telehealth: Payer: Self-pay | Admitting: Family Medicine

## 2020-05-01 NOTE — Telephone Encounter (Signed)
Returning  Call from nurse in regards for Lab results

## 2020-05-01 NOTE — Telephone Encounter (Signed)
Call returned to patient. RN reviewed TR. All questions answered. Patient encouraged to schedule appointment for IMM for HepB and Hep A vaccination.   Harvie Heck, RN

## 2020-05-02 ENCOUNTER — Telehealth: Payer: Self-pay | Admitting: Family Medicine

## 2020-05-02 NOTE — Telephone Encounter (Signed)
SPOUSE WANTED RESULTS. STATES SHE HAS THE PASSCODE TO ACCESS RESULTS.

## 2020-05-02 NOTE — Telephone Encounter (Signed)
Phone call to pt at the number he confirmed at his last appt, [phone].  Pt states he already received his test results and his wife called for the results also (pt already knew his wife had called). Pt counseled that he would need to share the results if he chooses to do so. Offered for pt to come to [facility] to pick up copy of results (after signing ROI), or if he has My Chart, can access them online. Pt states he does have My Chart and will access them that way.

## 2021-05-26 IMAGING — DX DG FOOT COMPLETE 3+V*R*
3 series · 3 of 3 positions shown · non-contrast
Comparison: None.

CLINICAL DATA: 43-year-old male with right foot injury.

EXAM:
RIGHT FOOT COMPLETE - 3+ VIEW

[foot ap]
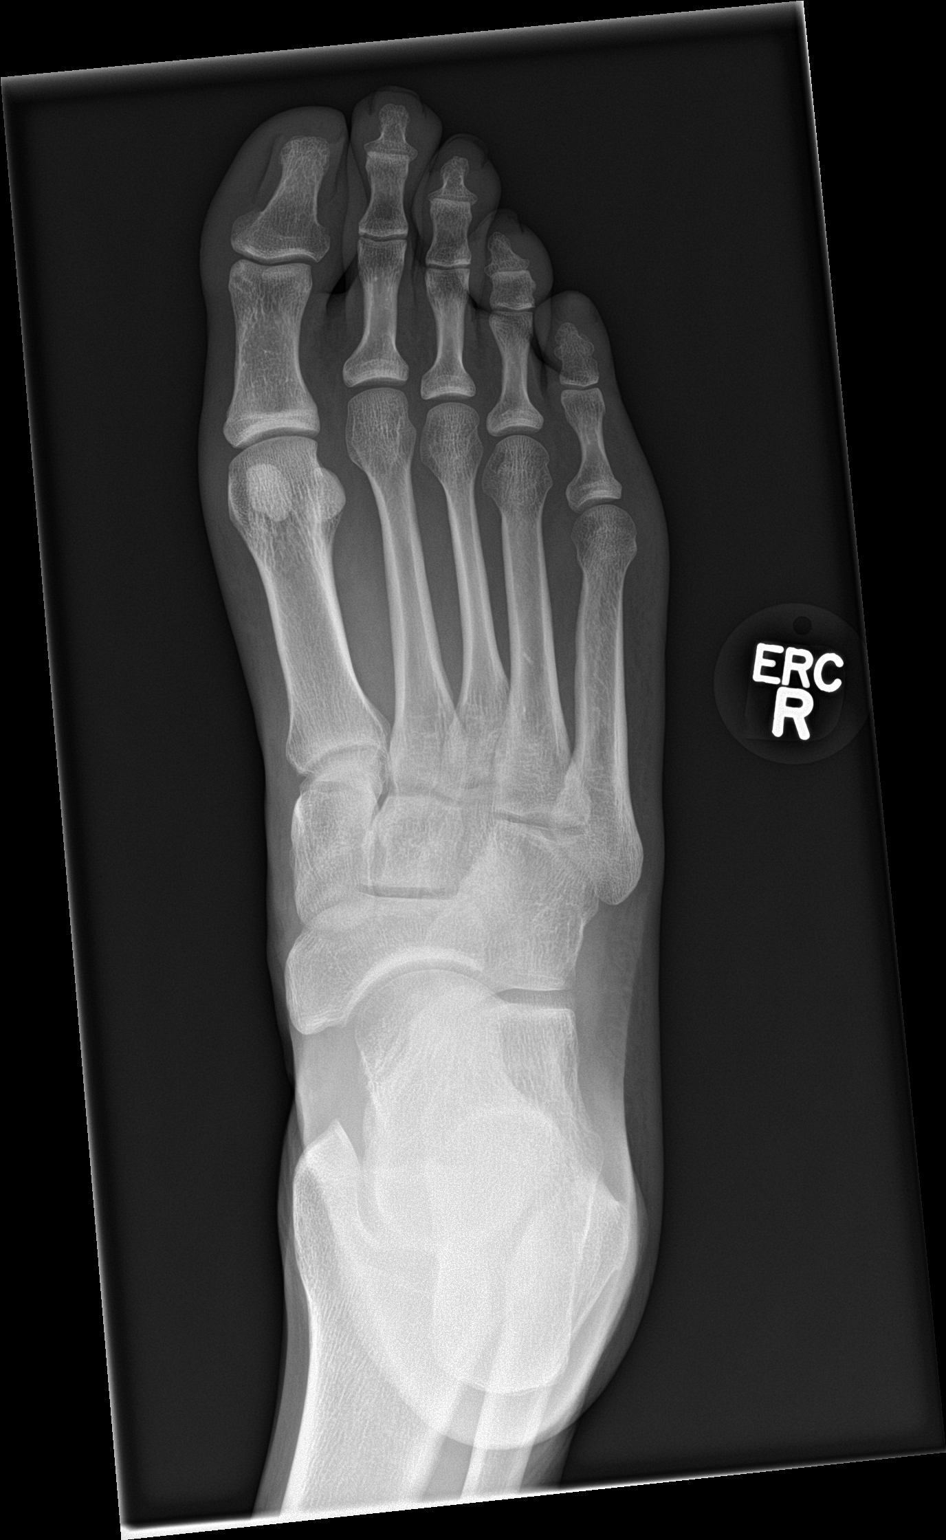

[foot obl]
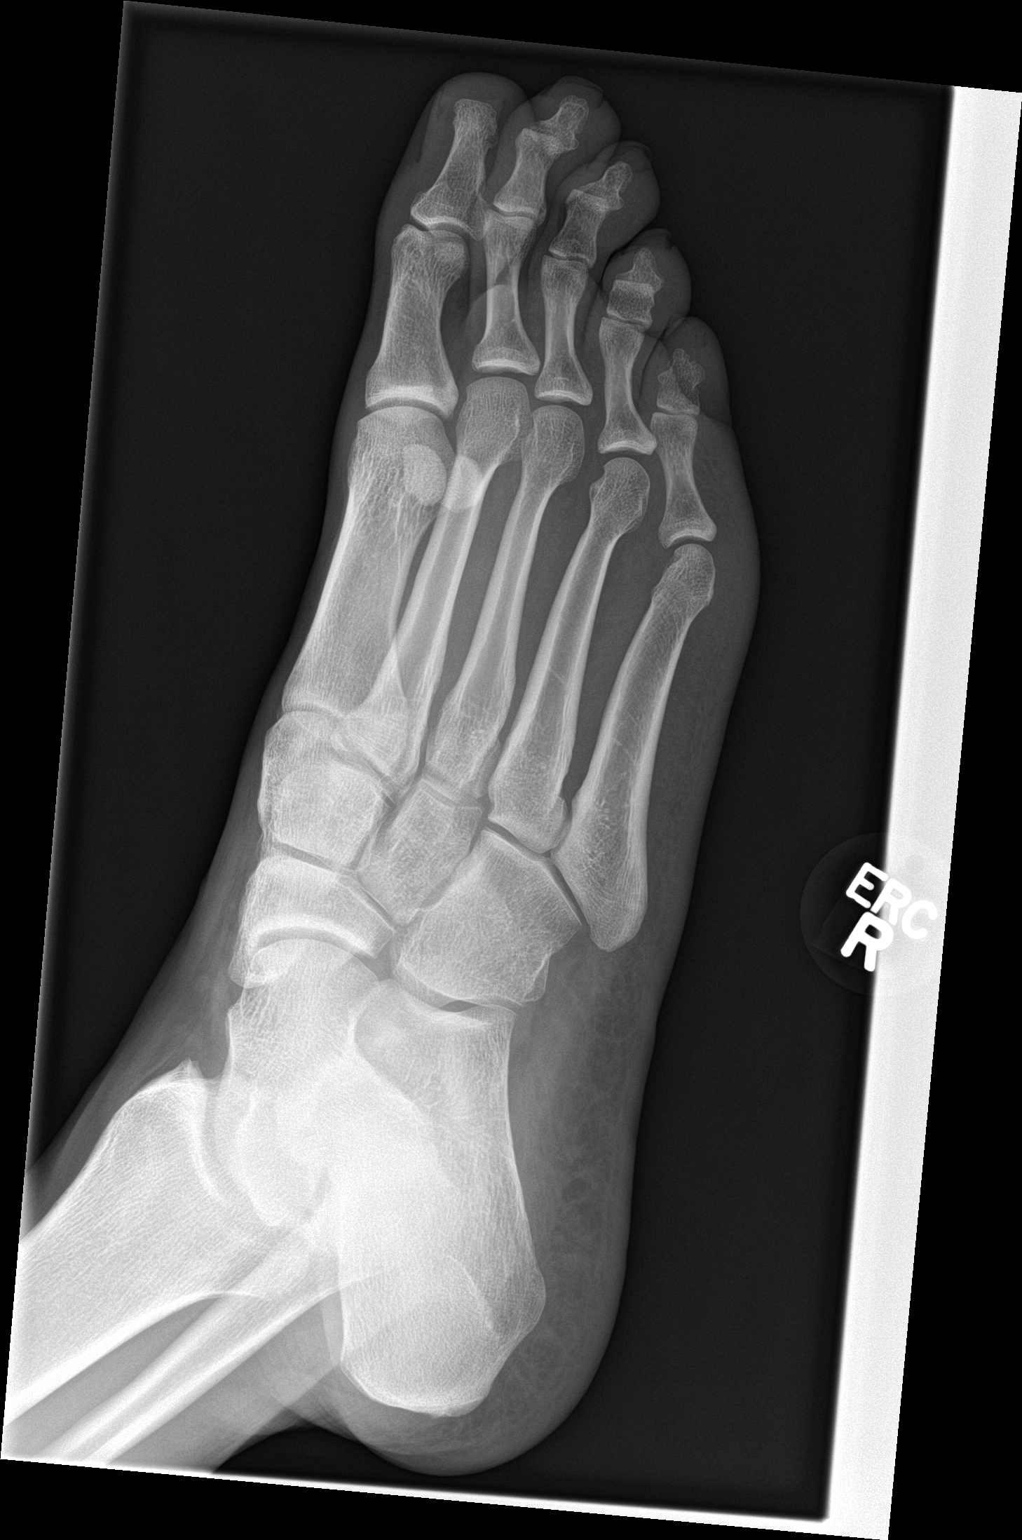

[foot lat]
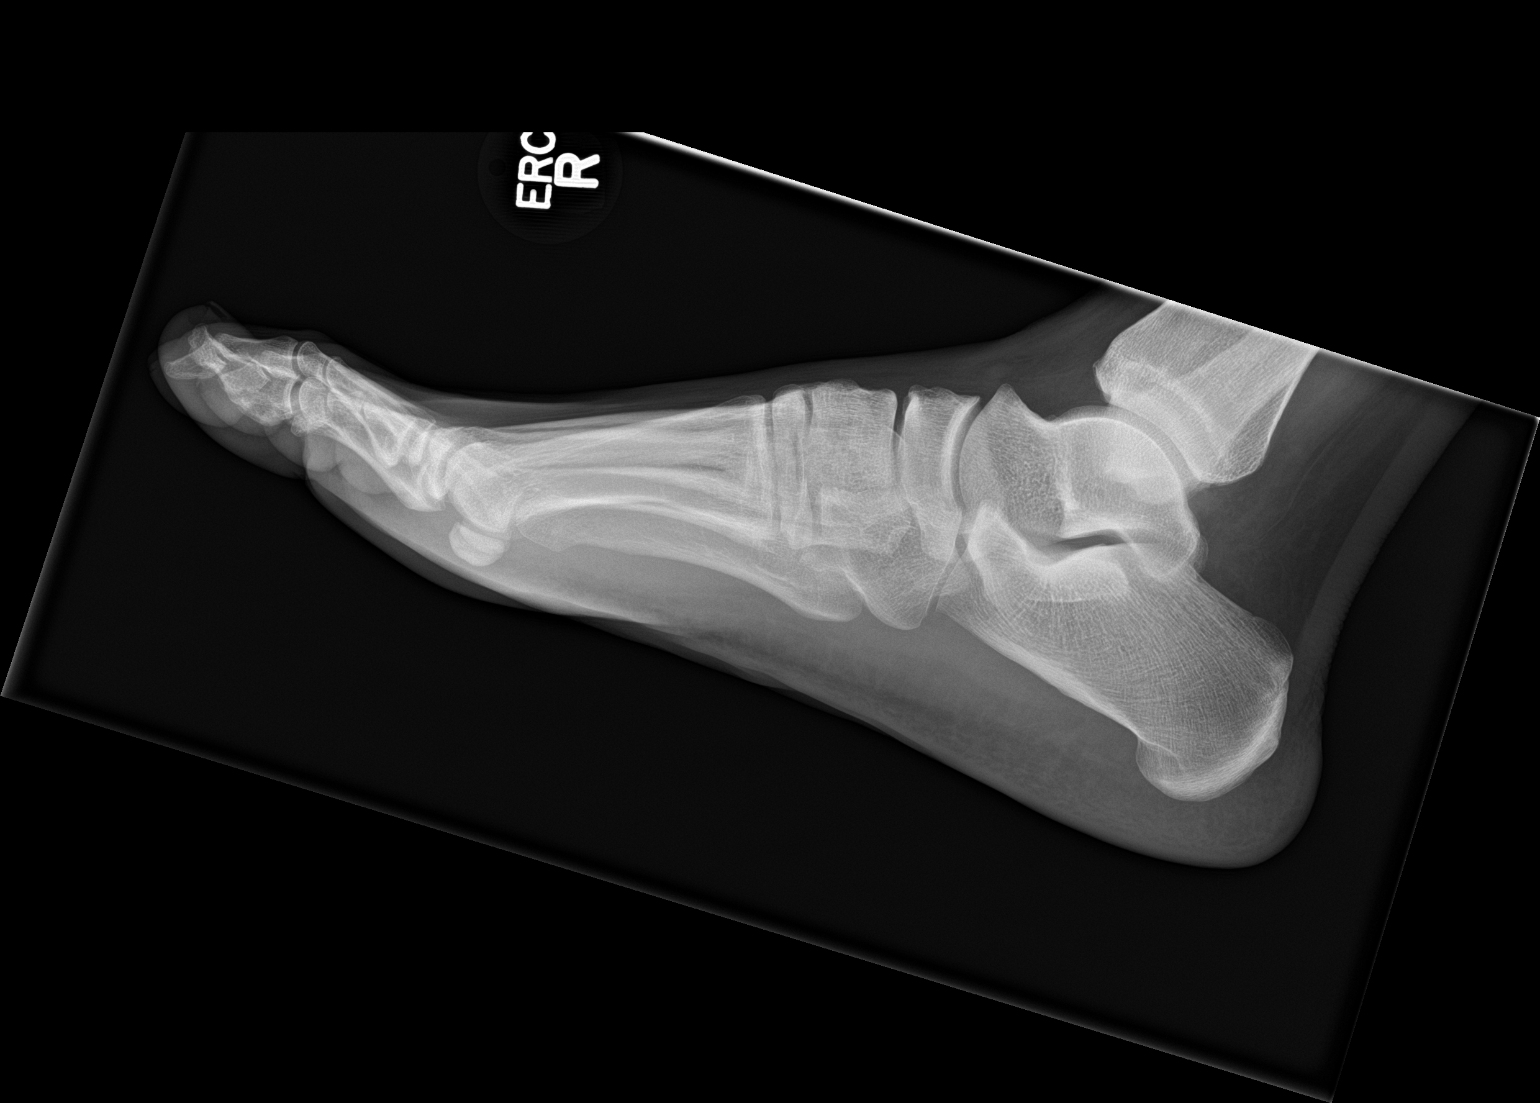

[3 of 3 positions shown; findings below may reference images not displayed]

FINDINGS: There is no evidence of fracture or dislocation. There is no
evidence of arthropathy or other focal bone abnormality. Soft
tissues are unremarkable.
IMPRESSION: Negative.

## 2022-01-02 ENCOUNTER — Other Ambulatory Visit: Payer: Self-pay

## 2022-01-02 ENCOUNTER — Emergency Department
Admission: EM | Admit: 2022-01-02 | Discharge: 2022-01-02 | Disposition: A | Discharge status: Home / Self Care | Payer: Self-pay | Attending: Emergency Medicine | Admitting: Emergency Medicine

## 2022-01-02 ENCOUNTER — Encounter: Payer: Self-pay | Admitting: Emergency Medicine

## 2022-01-02 ENCOUNTER — Emergency Department: Payer: Self-pay

## 2022-01-02 DIAGNOSIS — J45909 Unspecified asthma, uncomplicated: Secondary | ICD-10-CM | POA: Insufficient documentation

## 2022-01-02 DIAGNOSIS — R079 Chest pain, unspecified: Secondary | ICD-10-CM

## 2022-01-02 DIAGNOSIS — F439 Reaction to severe stress, unspecified: Secondary | ICD-10-CM | POA: Insufficient documentation

## 2022-01-02 DIAGNOSIS — R030 Elevated blood-pressure reading, without diagnosis of hypertension: Secondary | ICD-10-CM | POA: Insufficient documentation

## 2022-01-02 LAB — CBC
HCT: 40.6 % (ref 39.0–52.0)
Hemoglobin: 12.8 g/dL — ABNORMAL LOW (ref 13.0–17.0)
MCH: 28.6 pg (ref 26.0–34.0)
MCHC: 31.5 g/dL (ref 30.0–36.0)
MCV: 90.8 fL (ref 80.0–100.0)
Platelets: 223 10*3/uL (ref 150–400)
RBC: 4.47 MIL/uL (ref 4.22–5.81)
RDW: 13.4 % (ref 11.5–15.5)
WBC: 5.7 10*3/uL (ref 4.0–10.5)
nRBC: 0 % (ref 0.0–0.2)

## 2022-01-02 LAB — BASIC METABOLIC PANEL
Anion gap: 5 (ref 5–15)
BUN: 19 mg/dL (ref 6–20)
CO2: 24 mmol/L (ref 22–32)
Calcium: 8 mg/dL — ABNORMAL LOW (ref 8.9–10.3)
Chloride: 111 mmol/L (ref 98–111)
Creatinine, Ser: 1.04 mg/dL (ref 0.61–1.24)
GFR, Estimated: >60 mL/min (ref >60)
Glucose, Bld: 82 mg/dL (ref 70–99)
Potassium: 4.1 mmol/L (ref 3.5–5.1)
Sodium: 140 mmol/L (ref 135–145)

## 2022-01-02 LAB — TROPONIN I (HIGH SENSITIVITY): Troponin I (High Sensitivity): 3 ng/L (ref <18)

## 2022-01-02 NOTE — Discharge Instructions (Addendum)
Your exam, blood pressure, labs, EKG, and Chest XR are normal. Follow-up with a local community clinic for further medical care.

## 2022-01-02 NOTE — ED Provider Triage Note (Signed)
Emergency Medicine Provider Triage Evaluation Note  Marc Booth , a 46 y.o. male  was evaluated in triage.  Pt complains of chest pain, says chest doesn't feel right, started yesterday, hx of anxiety also.  Review of Systems  Positive:  Negative:   Physical Exam  BP (!) 136/93 (BP Location: Right Arm)   Pulse 63   Temp 98.3 F (36.8 C) (Oral)   Resp 18   SpO2 99%  Gen:   Awake, no distress   Resp:  Normal effort  MSK:   Moves extremities without difficulty  Other:    Medical Decision Making  Medically screening exam initiated at 5:26 PM.  Appropriate orders placed.  Marc Booth was informed that the remainder of the evaluation will be completed by another provider, this initial triage assessment does not replace that evaluation, and the importance of remaining in the ED until their evaluation is complete.     Marc Starks, PA-C 01/02/22 1728

## 2022-01-02 NOTE — ED Provider Notes (Signed)
[facility] Emergency Department Provider Note     Event Date/Time   First MD Initiated Contact with Patient 01/02/22 1840     (approximate)   History   Chest Pain   HPI  Marc Booth is a 46 y.o. male with a history of asthma, reflux who presents to the ED from work for evaluation of chest pain.  Patient denies any current chest pain.  He denies any associated shortness of breath, cough, congestion.  Patient is also noted some elevated blood pressure readings at work.  He also endorses some external stress related to his work situation.     Physical Exam   Triage Vital Signs: ED Triage Vitals  Enc Vitals Group     BP 01/02/22 1719 (!) 136/93     Pulse Rate 01/02/22 1719 63     Resp 01/02/22 1719 18     Temp 01/02/22 1719 98.3 F (36.8 C)     Temp Source 01/02/22 1719 Oral     SpO2 01/02/22 1719 99 %     Weight --      Height --      Head Circumference --      Peak Flow --      Pain Score 01/02/22 1716 0     Pain Loc --      Pain Edu? --      Excl. in [place]? --     Most recent vital signs: Vitals:   01/02/22 2109 01/02/22 2124  BP: 127/85 120/82  Pulse: 61 60  Resp:  17  Temp: 98 F (36.7 C) 98.1 F (36.7 C)  SpO2: 100% 100%    General Awake, no distress. NAD HEENT NCAT. PERRL. EOMI. No rhinorrhea. Mucous membranes are moist.  CV:  Good peripheral perfusion.  RESP:  Normal effort.  ABD:  No distention.     ED Results / Procedures / Treatments   Labs (all labs ordered are listed, but only abnormal results are displayed) Labs Reviewed  CBC - Abnormal; Notable for the following components:      Result Value   Hemoglobin 12.8 (*)    All other components within normal limits  BASIC METABOLIC PANEL - Abnormal; Notable for the following components:   Calcium 8.0 (*)    All other components within normal limits  TROPONIN I (HIGH SENSITIVITY)     EKG  Vent. rate 70 BPM PR interval 148 ms QRS duration 72 ms QT/QTcB  368/397 ms P-R-T axes 69 69 65 NSR No ST changes   RADIOLOGY  I personally viewed and evaluated these images as part of my medical decision making, as well as reviewing the written report by the radiologist.  ED Provider Interpretation: no acute findings  DG Chest 2 View  Result Date: 01/02/2022 CLINICAL DATA:  Chest pain.  Smoker. EXAM: CHEST - 2 VIEW COMPARISON:  None Available. FINDINGS: Normal sized heart. The lungs are mildly hyperexpanded with mild peribronchial thickening. Unremarkable bones. IMPRESSION: Mild changes of COPD and chronic bronchitis. No acute abnormality. Electronically Signed   By: Claudie Revering M.D.   On: 01/02/2022 18:10     PROCEDURES:  Critical Care performed: No  Procedures   MEDICATIONS ORDERED IN ED: Medications - No data to display   IMPRESSION / MDM / [place] / ED COURSE  I reviewed the triage vital signs and the nursing notes.  Differential diagnosis includes, but is not limited to, ACS, aortic dissection, pulmonary embolism, cardiac tamponade, pneumothorax, pneumonia, pericarditis, myocarditis, GI-related causes including esophagitis/gastritis, and musculoskeletal chest wall pain.     Patient's presentation is most consistent with acute presentation with potential threat to life or bodily function.  Patient's diagnosis is consistent with nonspecific chest pain now resolved at the time of his evaluation.  Patient with a reassuring exam and work-up overall.  No lab abnormalities noted including acute leukocytosis or critical anemia.  Troponin is negative x1.  No malignant arrhythmia seen on his EKG.  Patient is PERC negative and has normalized vital signs at this time.  Patient requesting to be discharged home, and has no indication for prescriptions for elevated blood pressure. Patient is to follow up with local community clinic for routine medical care, as needed or otherwise directed. Patient is given ED  precautions to return to the ED for any worsening or new symptoms.     FINAL CLINICAL IMPRESSION(S) / ED DIAGNOSES   Final diagnoses:  Nonspecific chest pain     Rx / DC Orders   ED Discharge Orders     None        Note:  This document was prepared using Dragon voice recognition software and may include unintentional dictation errors.    Melvenia Needles, PA-C 01/03/22 0028    Merlyn Lot, MD 01/09/22 1102

## 2022-01-02 NOTE — ED Notes (Signed)
First Nurse Note: Pt to ED via [facility] from Urgent care for chest pain. Pt VSS. Pt has 18 G in [place].

## 2022-01-02 NOTE — ED Triage Notes (Signed)
Patient to ED via [facility] from work for chest pain. No current chest pain at this time. Patient states his blood pressure is also high and "has a weird feeling that I can't describe." Patient states he "has a lot going on and is stress."

## 2022-03-04 DIAGNOSIS — R69 Illness, unspecified: Secondary | ICD-10-CM | POA: Diagnosis not present

## 2022-03-04 DIAGNOSIS — I10 Essential (primary) hypertension: Secondary | ICD-10-CM | POA: Diagnosis not present

## 2022-03-04 DIAGNOSIS — F32A Depression, unspecified: Secondary | ICD-10-CM | POA: Diagnosis not present

## 2022-08-04 DIAGNOSIS — R369 Urethral discharge, unspecified: Secondary | ICD-10-CM | POA: Diagnosis not present

## 2022-08-11 ENCOUNTER — Ambulatory Visit: Payer: Self-pay | Admitting: Family Medicine

## 2022-08-11 ENCOUNTER — Encounter: Payer: Self-pay | Admitting: Family Medicine

## 2022-08-11 DIAGNOSIS — Z113 Encounter for screening for infections with a predominantly sexual mode of transmission: Secondary | ICD-10-CM

## 2022-08-11 DIAGNOSIS — A749 Chlamydial infection, unspecified: Secondary | ICD-10-CM

## 2022-08-11 LAB — GRAM STAIN

## 2022-08-11 LAB — HM HIV SCREENING LAB: HM HIV Screening: NEGATIVE

## 2022-08-11 MED ORDER — DOXYCYCLINE HYCLATE 100 MG PO TABS: 100.0000 mg | ORAL_TABLET | Freq: Two times a day (BID) | ORAL | 0 refills | Status: AC

## 2022-08-11 NOTE — Progress Notes (Signed)
[facility] Department STI clinic/screening visit  Subjective:  Marc Booth is a 47 y.o. male being seen today for an STI screening visit. The patient reports they do have symptoms.    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 States last week he went to an urgent care and was treated for Chlamydia after his partner told him she was being treated for chlamydia.   Last HIV test per patient/review of record was  Lab Results  Component Value Date   HMHIVSCREEN Negative - Validated 04/30/2020   No results found for: "HIV"  Does the patient or their partner desires a pregnancy in the next year? No  Screening for MPX risk: Does the patient have an unexplained rash? No Is the patient MSM? No Does the patient endorse multiple sex partners or anonymous sex partners? No Did the patient have close or sexual contact with a person diagnosed with MPX? No Has the patient traveled outside the [place] where MPX is endemic? No Is there a high clinical suspicion for MPX-- evidenced by one of the following No  -Unlikely to be chickenpox  -Lymphadenopathy  -Rash that present in same phase of evolution on any given body part   See flowsheet for further details and programmatic requirements.    There is no immunization history on file for this patient.   The following portions of the patient's history were reviewed and updated as appropriate: allergies, current medications, past medical history, past social history, past surgical history and problem list.  Objective:  There were no vitals filed for this visit.  Physical Exam Constitutional:      Appearance:  Normal appearance.  HENT:     Head: Normocephalic and atraumatic.     Comments: No nits or hair loss    Mouth/Throat:     Mouth: Mucous membranes are moist. No oral lesions.     Pharynx: Oropharynx is clear. No oropharyngeal exudate or posterior oropharyngeal erythema.  Eyes:     General:        Right eye: No discharge.        Left eye: No discharge.     Conjunctiva/sclera:     Right eye: Right conjunctiva is not injected. No exudate.    Left eye: Left conjunctiva is not injected. No exudate. Pulmonary:     Effort: Pulmonary effort is normal.  Abdominal:     General: Abdomen is flat.     Palpations: Abdomen is soft. There is no hepatomegaly or mass.     Tenderness: There is no abdominal tenderness. There is no rebound.     Hernia: There is no hernia in the left inguinal area or right inguinal area.  Genitourinary:    Pubic Area: No rash or pubic lice (no nits).      Penis: Normal. No tenderness, discharge, swelling or lesions.      Testes: Normal.     Epididymis:     Right: Normal. No mass or tenderness.     Left: Normal. No mass or tenderness.     Rectum: Normal. No tenderness (no lesions or discharge).     Comments: Penile Discharge Amount: none Color:  none Lymphadenopathy:     Head:     Right side of head: No preauricular or posterior auricular adenopathy.     Left side of head: No preauricular or posterior auricular adenopathy.     Cervical: No cervical adenopathy.     Upper Body:     Right upper body: No supraclavicular, axillary or epitrochlear adenopathy.     Left upper body: No supraclavicular, axillary or epitrochlear adenopathy.     Lower Body: No right inguinal adenopathy. No left inguinal adenopathy.  Skin:    General: Skin is warm and dry.     Findings: No lesion or rash.  Neurological:     Mental Status: He is alert and oriented to person, place, and time.     Assessment and Plan:  London Friloux is a 47 y.o. male presenting to the [facility] Department for STI screening  1. Screening for venereal disease  - Gram stain - HIV [facility] - Syphilis Serology, [facility] - Chlamydia/GC NAA, Confirmation  2. Chlamydia -treated today for chlamydia based on continued symptoms -pt has a bottle of doxycyline that he was given on 6/4- he has not been taking it as prescribed  - doxycycline (VIBRA-TABS) 100 MG tablet; Take 1 tablet (100 mg total) by mouth 2 (two) times daily for 7 days.  Dispense: 14 tablet; Refill: 0   Patient does have STI symptoms Patient accepted all screenings including  urine GC/Chlamydia, and blood work for HIV/Syphilis. Patient meets criteria for HepB screening? No. Ordered? not applicable Patient meets criteria for HepC screening? No. Ordered? not applicable Recommended condom use with all sex Discussed importance of condom use for STI prevent  Treat positive test results per standing order. Discussed time line for State Lab results and that patient will be called with positive results and encouraged patient to call if he had not heard in 2 weeks Recommended repeat testing in 3 months with positive results. Recommended returning for continued or worsening symptoms.   Return if symptoms worsen or fail to improve, for STI screening.  No future appointments. Total time spent 20 minutes  Lenice Llamas, [place]

## 2022-08-11 NOTE — Progress Notes (Signed)
Per client, has been eating onions now for a long time with no problems. Requested onion be removed from allergy list. Jossie Ng, RN Gram stain reviewed with diplococci absent and WBC greater than / equal to 2/hpf. Client prescribed Doxycycline 100 mg po BID x7 days at Urgent Care on 08/04/2022. Client has medicine with him and currently has 8 doses remaining. Per consult with Aliene Altes FNP-C, discard prior Doxycycline and start over with Doxycycline 100mg  po BID x7 days beginning this pm. Client counseled regarding how to take medicine and importance of taking per bottle directions. Questions answered. Jossie Ng, RN

## 2022-08-15 LAB — CHLAMYDIA/GC NAA, CONFIRMATION
Chlamydia trachomatis, NAA: NEGATIVE
Neisseria gonorrhoeae, NAA: NEGATIVE

## 2022-10-21 ENCOUNTER — Other Ambulatory Visit: Payer: Self-pay

## 2022-10-21 ENCOUNTER — Emergency Department
Admission: EM | Admit: 2022-10-21 | Discharge: 2022-10-21 | Disposition: A | Discharge status: Home / Self Care | Payer: 59 | Attending: Emergency Medicine | Admitting: Emergency Medicine

## 2022-10-21 DIAGNOSIS — Z760 Encounter for issue of repeat prescription: Secondary | ICD-10-CM | POA: Diagnosis not present

## 2022-10-21 DIAGNOSIS — F419 Anxiety disorder, unspecified: Secondary | ICD-10-CM | POA: Insufficient documentation

## 2022-10-21 MED ORDER — ESCITALOPRAM OXALATE 10 MG PO TABS: 10.0000 mg | ORAL_TABLET | Freq: Every day | ORAL | 2 refills | Status: DC

## 2022-10-21 NOTE — ED Provider Notes (Signed)
   [facility] Provider Note    Event Date/Time   First MD Initiated Contact with Patient 10/21/22 1502     (approximate)   History   Medication Refill   HPI  Marc Booth is a 47 y.o. male with history of anxiety who presents for medication refill.  Patient reports he needs a refill of his Lexapro.  He has no psychiatric issues at this time but does report mildly increased anxiety     Physical Exam   Triage Vital Signs: ED Triage Vitals [10/21/22 1315]  Encounter Vitals Group     BP (!) 128/95     Systolic BP Percentile      Diastolic BP Percentile      Pulse Rate 72     Resp 18     Temp 98 F (36.7 C)     Temp Source Oral     SpO2 98 %     Weight      Height      Head Circumference      Peak Flow      Pain Score 0     Pain Loc      Pain Education      Exclude from Growth Chart     Most recent vital signs: Vitals:   10/21/22 1315  BP: (!) 128/95  Pulse: 72  Resp: 18  Temp: 98 F (36.7 C)  SpO2: 98%     General: Awake, no distress.  CV:  Good peripheral perfusion.  Resp:  Normal effort. Abd:  No distention.  Other:     ED Results / Procedures / Treatments   Labs (all labs ordered are listed, but only abnormal results are displayed) Labs Reviewed - No data to display   EKG      PROCEDURES:  Critical Care performed:   Procedures   MEDICATIONS ORDERED IN ED: Medications - No data to display   IMPRESSION / MDM / ASSESSMENT AND PLAN / ED COURSE  I reviewed the triage vital signs and the nursing notes. Patient's presentation is most consistent with acute, uncomplicated illness.  Patient here for medication refill, 67-month supply sent to his pharmacy.  No indication for other workup at this time      FINAL CLINICAL IMPRESSION(S) / ED DIAGNOSES   Final diagnoses:  Medication refill     Rx / DC Orders   ED Discharge Orders          Ordered    Ambulatory Referral to Primary Care (Establish Care)         10/21/22 1508    escitalopram (LEXAPRO) 10 MG tablet  Daily,   Status:  Discontinued        10/21/22 1508    escitalopram (LEXAPRO) 10 MG tablet  Daily        10/21/22 1518             Note:  This document was prepared using Dragon voice recognition software and may include unintentional dictation errors.   Jene Every, MD 10/21/22 [phone]

## 2022-10-21 NOTE — ED Triage Notes (Signed)
Pt to ED via POV from home. Pt reports need refill for anxiety medication and states will lose his job if he is not able to get a refill. Pt reports has not been able to establish PCP.

## 2022-10-30 ENCOUNTER — Telehealth: Payer: Self-pay

## 2022-10-30 NOTE — Telephone Encounter (Signed)
 Transition Care Management Unsuccessful Follow-up Telephone Call  Date of discharge and from where:  [facility] 8/21  Attempts:  1st Attempt  Reason for unsuccessful TCM follow-up call:  No answer/busy   Lenard Forth Dawson  [facility], [facility] Guide, Phone: [phone] Website: [email or web address]

## 2023-04-13 ENCOUNTER — Encounter: Payer: Self-pay | Admitting: Emergency Medicine

## 2023-04-13 ENCOUNTER — Emergency Department
Admission: EM | Admit: 2023-04-13 | Discharge: 2023-04-13 | Disposition: A | Discharge status: Home / Self Care | Payer: Worker's Compensation | Attending: Emergency Medicine | Admitting: Emergency Medicine

## 2023-04-13 ENCOUNTER — Other Ambulatory Visit: Payer: Self-pay

## 2023-04-13 DIAGNOSIS — Z760 Encounter for issue of repeat prescription: Secondary | ICD-10-CM | POA: Insufficient documentation

## 2023-04-13 DIAGNOSIS — W228XXA Striking against or struck by other objects, initial encounter: Secondary | ICD-10-CM | POA: Insufficient documentation

## 2023-04-13 DIAGNOSIS — S01112A Laceration without foreign body of left eyelid and periocular area, initial encounter: Secondary | ICD-10-CM | POA: Diagnosis not present

## 2023-04-13 DIAGNOSIS — J45909 Unspecified asthma, uncomplicated: Secondary | ICD-10-CM | POA: Insufficient documentation

## 2023-04-13 DIAGNOSIS — Y990 Civilian activity done for income or pay: Secondary | ICD-10-CM | POA: Diagnosis not present

## 2023-04-13 DIAGNOSIS — S0592XA Unspecified injury of left eye and orbit, initial encounter: Secondary | ICD-10-CM | POA: Diagnosis present

## 2023-04-13 MED ORDER — ESCITALOPRAM OXALATE 10 MG PO TABS: 10.0000 mg | ORAL_TABLET | Freq: Every day | ORAL | 2 refills | Status: DC

## 2023-04-13 NOTE — ED Provider Notes (Signed)
[facility] Provider Note    Event Date/Time   First MD Initiated Contact with Patient 04/13/23 1931     (approximate)   History   Laceration   HPI  Marc Booth is a 48 y.o. male with PMH of asthma and anxiety who presents for evaluation of a laceration to the left eyebrow. 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  He lose consciousness and reports at the time he had a mild headache but this has since resolved.  This occurred around 9 AM at work this morning.  He states that one of his coworkers washed it with hydrogen peroxide and alcohol and then applied some bandages over top.  He also states that he needs a refill of his anxiety medication.  He recently moved to the area and has an appointment scheduled Medicare for March but is going to run out of his medication before then.      Physical Exam   Triage Vital Signs: ED Triage Vitals [04/13/23 1746]  Encounter Vitals Group     BP (!) 130/90     Systolic BP Percentile      Diastolic BP Percentile      Pulse Rate 73     Resp 18     Temp 97.7 F (36.5 C)     Temp Source Oral     SpO2 98 %     Weight 145 lb (65.8 kg)     Height 5\' 6"  (1.676 m)     Head Circumference      Peak Flow      Pain Score 7     Pain Loc      Pain Education      Exclude from Growth Chart     Most recent vital signs: Vitals:   04/13/23 1746  BP: (!) 130/90  Pulse: 73  Resp: 18  Temp: 97.7 F (36.5 C)  SpO2: 98%   General: Awake, no distress.  CV:  Good peripheral perfusion.  Resp:  Normal effort.  Abd:  No distention.  Other:  Approximately 1-1/2 cm laceration to the lateral left eyebrow, well-approximated and not actively bleeding.   ED Results / Procedures / Treatments   Labs (all labs ordered are listed, but only abnormal results are displayed) Labs Reviewed - No data to display    PROCEDURES:  Critical Care  performed: No  Procedures   MEDICATIONS ORDERED IN ED: Medications - No data to display   IMPRESSION / MDM / ASSESSMENT AND PLAN / ED COURSE  I reviewed the triage vital signs and the nursing notes.                             48 year old male presents for evaluation of laceration to the left eyebrow.  Patient was hypertensive in triage otherwise vital signs are stable.  Patient NAD on exam.  Differential diagnosis includes, but is not limited to, laceration, concussion, wound infection.  Patient's presentation is most consistent with acute, uncomplicated illness.  Patient's laceration is well-approximated, fairly superficial and not actively bleeding.  I do not think he needs stitches at this time.  Wound was washed with saline solution, wiped with alcohol and then Steri-Strips were applied.  We discussed wound care.    I will refill his anxiety medication.  He voiced understanding, all questions were answered and  he was stable at discharge.     FINAL CLINICAL IMPRESSION(S) / ED DIAGNOSES   Final diagnoses:  Eyebrow laceration, left, initial encounter  Medication refill     Rx / DC Orders   ED Discharge Orders          Ordered    escitalopram (LEXAPRO) 10 MG tablet  Daily        04/13/23 2017             Note:  This document was prepared using Dragon voice recognition software and may include unintentional dictation errors.   Cameron Ali, PA-C 04/13/23 2322    Dionne Bucy, MD 04/13/23 [phone]

## 2023-04-13 NOTE — ED Triage Notes (Signed)
Patient to ED via POV for laceration on left eyebrow. Pt states this occurred at work- drum fell an cut eyebrow. Denies LOC or blood thinners. Accident occurred at 9am. No bleeding at this time.

## 2023-04-13 NOTE — Discharge Instructions (Signed)
The Steri-Strips will begin to fall off on their own in about a week.  You can shower like normal and then pat dry.  I have sent a refill of your anxiety medication.  Please follow-up with your primary care provider for further refills.

## 2023-04-21 ENCOUNTER — Emergency Department
Admission: EM | Admit: 2023-04-21 | Discharge: 2023-04-21 | Disposition: A | Discharge status: Home / Self Care | Payer: 59 | Attending: Emergency Medicine | Admitting: Emergency Medicine

## 2023-04-21 DIAGNOSIS — R6889 Other general symptoms and signs: Secondary | ICD-10-CM

## 2023-04-21 DIAGNOSIS — R197 Diarrhea, unspecified: Secondary | ICD-10-CM | POA: Diagnosis not present

## 2023-04-21 DIAGNOSIS — R059 Cough, unspecified: Secondary | ICD-10-CM | POA: Diagnosis present

## 2023-04-21 DIAGNOSIS — R112 Nausea with vomiting, unspecified: Secondary | ICD-10-CM | POA: Diagnosis not present

## 2023-04-21 DIAGNOSIS — J029 Acute pharyngitis, unspecified: Secondary | ICD-10-CM | POA: Diagnosis not present

## 2023-04-21 LAB — RESP PANEL BY RT-PCR (RSV, FLU A&B, COVID)  RVPGX2
Influenza A by PCR: NEGATIVE
Influenza B by PCR: NEGATIVE
Resp Syncytial Virus by PCR: NEGATIVE
SARS Coronavirus 2 by RT PCR: NEGATIVE

## 2023-04-21 LAB — GROUP A STREP BY PCR: Group A Strep by PCR: NOT DETECTED

## 2023-04-21 MED ORDER — BENZONATATE 100 MG PO CAPS: 100.0000 mg | ORAL_CAPSULE | Freq: Three times a day (TID) | ORAL | 0 refills | Status: AC | PRN

## 2023-04-21 NOTE — ED Provider Notes (Signed)
[facility] Provider Note    Event Date/Time   First MD Initiated Contact with Patient 04/21/23 1108     (approximate)   History   Cough, Sore Throat, and Emesis   HPI  Marc Booth is a 48 y.o. male with history of anxiety presents emergency department with flulike symptoms.  Patient states has cough sore throat, some nausea vomiting and diarrhea started last night.  2 episodes of diarrhea today.  States he cannot sleep due to the coughing.  Unsure if he has been around anyone that sick.      Physical Exam   Triage Vital Signs: ED Triage Vitals  Encounter Vitals Group     BP 04/21/23 1101 138/82     Systolic BP Percentile --      Diastolic BP Percentile --      Pulse Rate 04/21/23 1101 86     Resp 04/21/23 1101 18     Temp 04/21/23 1101 97.7 F (36.5 C)     Temp Source 04/21/23 1101 Oral     SpO2 04/21/23 1101 97 %     Weight 04/21/23 1102 145 lb (65.8 kg)     Height 04/21/23 1102 5\' 6"  (1.676 m)     Head Circumference --      Peak Flow --      Pain Score 04/21/23 1102 0     Pain Loc --      Pain Education --      Exclude from Growth Chart --     Most recent vital signs: Vitals:   04/21/23 1101  BP: 138/82  Pulse: 86  Resp: 18  Temp: 97.7 F (36.5 C)  SpO2: 97%     General: Awake, no distress.   CV:  Good peripheral perfusion. regular rate and  rhythm Resp:  Normal effort. Lungs cta Abd:  No distention.   Other:      ED Results / Procedures / Treatments   Labs (all labs ordered are listed, but only abnormal results are displayed) Labs Reviewed  GROUP A STREP BY PCR  RESP PANEL BY RT-PCR (RSV, FLU A&B, COVID)  [id number]     EKG     RADIOLOGY     PROCEDURES:   Procedures Chief Complaint  Patient presents with   Cough   Sore Throat   Emesis      MEDICATIONS ORDERED IN ED: Medications - No data to display   IMPRESSION / MDM / ASSESSMENT AND PLAN / ED COURSE  I reviewed the triage vital signs and  the nursing notes.                              Differential diagnosis includes, but is not limited to, COVID, influenza, RSV, acute gastroenteritis, viral illness  Patient's presentation is most consistent with acute illness / injury with system symptoms.   Respiratory panel reassuring, strep test negative  Patient did explain findings to the patient.  Did explain to him that it is a flulike illness.  Even though this test is not positive I consider him to have fluid clear.  He was given prescription for [facility] for cough.  Follow-up with his regular doctor if not improving in 3 days.  Return if worsening.  Patient is in agreement treatment plan.  Discharged stable condition.  With a work note.      FINAL CLINICAL IMPRESSION(S) / ED DIAGNOSES   Final diagnoses:  Flu-like symptoms     Rx / DC Orders   ED Discharge Orders          Ordered    benzonatate (TESSALON PERLES) 100 MG capsule  3 times daily PRN        04/21/23 1206             Note:  This document was prepared using Dragon voice recognition software and may include unintentional dictation errors.    Faythe Ghee, PA-C 04/21/23 1208    Minna Antis, MD 04/21/23 1258

## 2023-04-21 NOTE — ED Triage Notes (Signed)
Pt c/o cough, sore throat, and n/v/d starting last night.  Currently, denies pain.    Pt reports using cough drops w/ some relief.

## 2023-09-15 ENCOUNTER — Other Ambulatory Visit: Payer: Self-pay

## 2023-09-15 ENCOUNTER — Emergency Department
Admission: EM | Admit: 2023-09-15 | Discharge: 2023-09-15 | Disposition: A | Discharge status: Home / Self Care | Payer: Self-pay | Attending: Emergency Medicine | Admitting: Emergency Medicine

## 2023-09-15 DIAGNOSIS — Z760 Encounter for issue of repeat prescription: Secondary | ICD-10-CM | POA: Insufficient documentation

## 2023-09-15 DIAGNOSIS — F419 Anxiety disorder, unspecified: Secondary | ICD-10-CM | POA: Insufficient documentation

## 2023-09-15 DIAGNOSIS — R200 Anesthesia of skin: Secondary | ICD-10-CM | POA: Insufficient documentation

## 2023-09-15 DIAGNOSIS — J45909 Unspecified asthma, uncomplicated: Secondary | ICD-10-CM | POA: Insufficient documentation

## 2023-09-15 MED ORDER — LIDOCAINE 5 % EX PTCH: 1.0000 | MEDICATED_PATCH | CUTANEOUS | 0 refills | Status: AC

## 2023-09-15 MED ORDER — ESCITALOPRAM OXALATE 10 MG PO TABS: 10.0000 mg | ORAL_TABLET | Freq: Every day | ORAL | 0 refills | Status: DC
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
## 2023-09-15 NOTE — Discharge Instructions (Addendum)
 Please follow-up with your primary care provider for further evaluation and management of chronic numbness and tingling in right thigh.  A referral has been placed for you.  This has also been added below for you to reach out and contact in order to establish care.  In the interim apply lidocaine  patches to the area when symptoms occur.   Please follow-up with psych for further medication management of your anxiety medicine.   Please go to the following website to schedule new (and existing) patient appointments:   [email or web address]   The following is a list of primary care offices in the area who are accepting new patients at this time.  Please reach out to one of them directly and let them know you would like to schedule an appointment to follow up on an Emergency Department visit, and/or to establish a new primary care provider (PCP).  There are likely other primary care clinics in the are who are accepting new patients, but this is an excellent place to start:  [facility] Lead physician: Dr Jon Eva [address] [place], [place] [address] [phone]  [facility] Lead Physician: Dr Dorette Loron [address], [place], [place] [address] [phone]  [facility]  Lead Physician: Dr Duwaine Louder [address] [place], [place] [address] [phone]  [facility] Lead Physician: Dr Marolyn Officer [address] [place], [place] [address] [phone]  [facility] Primary Care & Sports Medicine at [facility] Lead Physician: Dr Leita Adie [address] [place], [place], [place] [address] [phone]

## 2024-01-13 ENCOUNTER — Emergency Department
Admission: EM | Admit: 2024-01-13 | Discharge: 2024-01-13 | Disposition: A | Discharge status: Home / Self Care | Payer: Self-pay | Attending: Emergency Medicine | Admitting: Emergency Medicine

## 2024-01-13 ENCOUNTER — Other Ambulatory Visit: Payer: Self-pay

## 2024-01-13 DIAGNOSIS — R197 Diarrhea, unspecified: Secondary | ICD-10-CM | POA: Insufficient documentation

## 2024-01-13 DIAGNOSIS — Z760 Encounter for issue of repeat prescription: Secondary | ICD-10-CM | POA: Insufficient documentation

## 2024-01-13 LAB — URINALYSIS, ROUTINE W REFLEX MICROSCOPIC
Bacteria, UA: NONE SEEN
Bilirubin Urine: NEGATIVE
Glucose, UA: NEGATIVE mg/dL
Hgb urine dipstick: NEGATIVE
Ketones, ur: NEGATIVE mg/dL
Leukocytes,Ua: NEGATIVE
Nitrite: NEGATIVE
Protein, ur: NEGATIVE mg/dL
RBC / HPF: 0 RBC/hpf (ref 0–5)
Specific Gravity, Urine: 1.02 (ref 1.005–1.030)
Squamous Epithelial / HPF: 0 /HPF (ref 0–5)
pH: 5 (ref 5.0–8.0)

## 2024-01-13 LAB — CBC
HCT: 42.4 % (ref 39.0–52.0)
Hemoglobin: 13.6 g/dL (ref 13.0–17.0)
MCH: 30 pg (ref 26.0–34.0)
MCHC: 32.1 g/dL (ref 30.0–36.0)
MCV: 93.6 fL (ref 80.0–100.0)
Platelets: 207 K/uL (ref 150–400)
RBC: 4.53 MIL/uL (ref 4.22–5.81)
RDW: 13.5 % (ref 11.5–15.5)
WBC: 5.7 K/uL (ref 4.0–10.5)
nRBC: 0 % (ref 0.0–0.2)

## 2024-01-13 LAB — COMPREHENSIVE METABOLIC PANEL WITH GFR
ALT: 23 U/L (ref 0–44)
AST: 30 U/L (ref 15–41)
Albumin: 4.5 g/dL (ref 3.5–5.0)
Alkaline Phosphatase: 41 U/L (ref 38–126)
Anion gap: 9 (ref 5–15)
BUN: 14 mg/dL (ref 6–20)
CO2: 28 mmol/L (ref 22–32)
Calcium: 9.4 mg/dL (ref 8.9–10.3)
Chloride: 104 mmol/L (ref 98–111)
Creatinine, Ser: 1.15 mg/dL (ref 0.61–1.24)
GFR, Estimated: >60 mL/min (ref >60)
Glucose, Bld: 92 mg/dL (ref 70–99)
Potassium: 4.1 mmol/L (ref 3.5–5.1)
Sodium: 141 mmol/L (ref 135–145)
Total Bilirubin: 0.5 mg/dL (ref 0.0–1.2)
Total Protein: 6.7 g/dL (ref 6.5–8.1)

## 2024-01-13 LAB — LIPASE, BLOOD: Lipase: 20 U/L (ref 11–51)

## 2024-01-13 MED ORDER — ESCITALOPRAM OXALATE 10 MG PO TABS: 10.0000 mg | ORAL_TABLET | Freq: Every day | ORAL | 1 refills | Status: AC

## 2024-01-13 NOTE — ED Provider Notes (Signed)
 [facility] Provider Note    Event Date/Time   First MD Initiated Contact with Patient 01/13/24 [phone]     (approximate)   History   Diarrhea   HPI Marc Booth is a 48 y.o. male with a history of asthma, anxiety who presents with complaints of diarrhea.  Patient reports loose stools over the last 2 days, denies abdominal pain.  No nausea or vomiting.  He wants to be checked out .  Because he has a history of blood in his stool before.  He has not seen any blood in his stool this time however.  Overall he feels well and feels that his symptoms are improving.  He is requesting a refill of his escitalopram      Physical Exam   Triage Vital Signs: ED Triage Vitals  Encounter Vitals Group     BP 01/13/24 0848 (!) 129/92     Girls Systolic BP Percentile --      Girls Diastolic BP Percentile --      Boys Systolic BP Percentile --      Boys Diastolic BP Percentile --      Pulse Rate 01/13/24 0848 70     Resp 01/13/24 0848 18     Temp 01/13/24 0848 97.9 F (36.6 C)     Temp Source 01/13/24 0848 Oral     SpO2 01/13/24 0848 100 %     Weight 01/13/24 0847 68.9 kg (152 lb)     Height 01/13/24 0847 1.702 m (5' 7)     Head Circumference --      Peak Flow --      Pain Score 01/13/24 0847 0     Pain Loc --      Pain Education --      Exclude from Growth Chart --     Most recent vital signs: Vitals:   01/13/24 0848  BP: (!) 129/92  Pulse: 70  Resp: 18  Temp: 97.9 F (36.6 C)  SpO2: 100%     General: Awake, no distress.  Well-appearing, comfortable CV:  Good peripheral perfusion.  Resp:  Normal effort.  Abd:  No distention.  Soft, nontender, reassuring exam Other:     ED Results / Procedures / Treatments   Labs (all labs ordered are listed, but only abnormal results are displayed) Labs Reviewed  URINALYSIS, ROUTINE W REFLEX MICROSCOPIC - Abnormal; Notable for the following components:      Result Value   Color, Urine YELLOW (*)     APPearance CLEAR (*)    All other components within normal limits  LIPASE, BLOOD  COMPREHENSIVE METABOLIC PANEL WITH GFR  CBC     EKG     RADIOLOGY     PROCEDURES:  Critical Care performed:   Procedures   MEDICATIONS ORDERED IN ED: Medications - No data to display   IMPRESSION / MDM / ASSESSMENT AND PLAN / ED COURSE  I reviewed the triage vital signs and the nursing notes. Patient's presentation is most consistent with acute illness / injury with system symptoms.  Patient presents with primary complaint of diarrhea, no recent antibiotics, benign abdominal exam.  Suspect viral versus foodborne illness, recommend supportive care, labs reviewed and are reassuring, he is reassured by this  No bleeding reported.  Appropriate for discharge, will refill his anxiety medication        FINAL CLINICAL IMPRESSION(S) / ED DIAGNOSES   Final diagnoses:  Diarrhea, unspecified type     Rx / DC Orders  ED Discharge Orders          Ordered    escitalopram  (LEXAPRO ) 10 MG tablet  Daily        01/13/24 1009             Note:  This document was prepared using Dragon voice recognition software and may include unintentional dictation errors.   Arlander Charleston, MD 01/13/24 [phone]

## 2024-01-13 NOTE — ED Triage Notes (Signed)
 Patient states diarrhea for two days; unsure if he saw blood in stool or not but states he has a history of GI bleed several years ago so he wanted to be seen. Denies N/V and abdominal pain.
